# Patient Record
Sex: Female | Born: 1975 | Race: White | Hispanic: No | Marital: Married | Smoking: Never smoker
Health system: Southern US, Community
[De-identification: ages and names within clinical notes are randomized; demographics above are authoritative.]

## PROBLEM LIST (undated history)

## (undated) DIAGNOSIS — E119 Type 2 diabetes mellitus without complications: Secondary | ICD-10-CM

## (undated) DIAGNOSIS — E039 Hypothyroidism, unspecified: Secondary | ICD-10-CM

## (undated) DIAGNOSIS — E669 Obesity, unspecified: Secondary | ICD-10-CM

## (undated) DIAGNOSIS — J45909 Unspecified asthma, uncomplicated: Secondary | ICD-10-CM

## (undated) DIAGNOSIS — B369 Superficial mycosis, unspecified: Secondary | ICD-10-CM

## (undated) DIAGNOSIS — F32A Depression, unspecified: Secondary | ICD-10-CM

## (undated) DIAGNOSIS — F909 Attention-deficit hyperactivity disorder, unspecified type: Secondary | ICD-10-CM

## (undated) DIAGNOSIS — L719 Rosacea, unspecified: Secondary | ICD-10-CM

## (undated) DIAGNOSIS — I10 Essential (primary) hypertension: Secondary | ICD-10-CM

## (undated) DIAGNOSIS — T7840XA Allergy, unspecified, initial encounter: Secondary | ICD-10-CM

## (undated) DIAGNOSIS — N926 Irregular menstruation, unspecified: Secondary | ICD-10-CM

## (undated) DIAGNOSIS — N946 Dysmenorrhea, unspecified: Secondary | ICD-10-CM

## (undated) DIAGNOSIS — A6920 Lyme disease, unspecified: Secondary | ICD-10-CM

## (undated) DIAGNOSIS — N888 Other specified noninflammatory disorders of cervix uteri: Secondary | ICD-10-CM

## (undated) DIAGNOSIS — M1990 Unspecified osteoarthritis, unspecified site: Secondary | ICD-10-CM

## (undated) DIAGNOSIS — D708 Other neutropenia: Secondary | ICD-10-CM

## (undated) DIAGNOSIS — F419 Anxiety disorder, unspecified: Secondary | ICD-10-CM

## (undated) DIAGNOSIS — N921 Excessive and frequent menstruation with irregular cycle: Secondary | ICD-10-CM

## (undated) DIAGNOSIS — F329 Major depressive disorder, single episode, unspecified: Secondary | ICD-10-CM

## (undated) DIAGNOSIS — D219 Benign neoplasm of connective and other soft tissue, unspecified: Secondary | ICD-10-CM

## (undated) DIAGNOSIS — R635 Abnormal weight gain: Secondary | ICD-10-CM

## (undated) DIAGNOSIS — L121 Cicatricial pemphigoid: Secondary | ICD-10-CM

## (undated) HISTORY — DX: Essential (primary) hypertension: I10

## (undated) HISTORY — DX: Other specified noninflammatory disorders of cervix uteri: N88.8

## (undated) HISTORY — DX: Allergy, unspecified, initial encounter: T78.40XA

## (undated) HISTORY — DX: Depression, unspecified: F32.A

## (undated) HISTORY — DX: Benign neoplasm of connective and other soft tissue, unspecified: D21.9

## (undated) HISTORY — DX: Other neutropenia: D70.8

## (undated) HISTORY — DX: Type 2 diabetes mellitus without complications: E11.9

## (undated) HISTORY — PX: WISDOM TOOTH EXTRACTION: SHX21

## (undated) HISTORY — DX: Rosacea, unspecified: L71.9

## (undated) HISTORY — DX: Anxiety disorder, unspecified: F41.9

## (undated) HISTORY — DX: Unspecified asthma, uncomplicated: J45.909

## (undated) HISTORY — DX: Major depressive disorder, single episode, unspecified: F32.9

## (undated) HISTORY — DX: Dysmenorrhea, unspecified: N94.6

## (undated) HISTORY — DX: Abnormal weight gain: R63.5

## (undated) HISTORY — DX: Excessive and frequent menstruation with irregular cycle: N92.1

## (undated) HISTORY — PX: TONSILLECTOMY: SUR1361

## (undated) HISTORY — PX: CHOLECYSTECTOMY: SHX55

## (undated) HISTORY — DX: Attention-deficit hyperactivity disorder, unspecified type: F90.9

## (undated) HISTORY — DX: Superficial mycosis, unspecified: B36.9

## (undated) HISTORY — DX: Cicatricial pemphigoid: L12.1

## (undated) HISTORY — DX: Obesity, unspecified: E66.9

## (undated) HISTORY — DX: Irregular menstruation, unspecified: N92.6

## (undated) HISTORY — DX: Lyme disease, unspecified: A69.20

## (undated) HISTORY — DX: Hypothyroidism, unspecified: E03.9

## (undated) HISTORY — PX: OTHER SURGICAL HISTORY: SHX169

---

## 2000-11-21 ENCOUNTER — Other Ambulatory Visit: Admission: RE | Admit: 2000-11-21 | Discharge: 2000-11-21 | Payer: Self-pay | Admitting: Otolaryngology

## 2000-11-21 ENCOUNTER — Encounter (INDEPENDENT_AMBULATORY_CARE_PROVIDER_SITE_OTHER): Payer: Self-pay | Admitting: Specialist

## 2001-02-01 ENCOUNTER — Other Ambulatory Visit: Admission: RE | Admit: 2001-02-01 | Discharge: 2001-02-01 | Payer: Self-pay | Admitting: Obstetrics and Gynecology

## 2002-08-07 ENCOUNTER — Ambulatory Visit (HOSPITAL_COMMUNITY): Admission: RE | Admit: 2002-08-07 | Discharge: 2002-08-07 | Payer: Self-pay | Admitting: Obstetrics & Gynecology

## 2003-02-06 ENCOUNTER — Emergency Department (HOSPITAL_COMMUNITY): Admission: EM | Admit: 2003-02-06 | Discharge: 2003-02-06 | Payer: Self-pay | Admitting: *Deleted

## 2003-06-24 ENCOUNTER — Ambulatory Visit (HOSPITAL_COMMUNITY): Admission: RE | Admit: 2003-06-24 | Discharge: 2003-06-24 | Payer: Self-pay | Admitting: Obstetrics & Gynecology

## 2005-04-11 ENCOUNTER — Inpatient Hospital Stay (HOSPITAL_COMMUNITY): Admission: AD | Admit: 2005-04-11 | Discharge: 2005-04-14 | Payer: Self-pay | Admitting: Obstetrics and Gynecology

## 2005-04-12 ENCOUNTER — Encounter (INDEPENDENT_AMBULATORY_CARE_PROVIDER_SITE_OTHER): Payer: Self-pay | Admitting: *Deleted

## 2005-04-15 ENCOUNTER — Inpatient Hospital Stay (HOSPITAL_COMMUNITY): Admission: AD | Admit: 2005-04-15 | Discharge: 2005-04-18 | Payer: Self-pay | Admitting: Obstetrics and Gynecology

## 2005-04-16 ENCOUNTER — Ambulatory Visit: Payer: Self-pay | Admitting: Internal Medicine

## 2005-04-21 ENCOUNTER — Ambulatory Visit: Payer: Self-pay | Admitting: Internal Medicine

## 2005-05-08 ENCOUNTER — Ambulatory Visit: Payer: Self-pay | Admitting: Internal Medicine

## 2005-05-15 ENCOUNTER — Encounter: Payer: Self-pay | Admitting: Cardiology

## 2005-05-15 ENCOUNTER — Ambulatory Visit: Payer: Self-pay

## 2005-11-09 ENCOUNTER — Other Ambulatory Visit: Admission: RE | Admit: 2005-11-09 | Discharge: 2005-11-09 | Payer: Self-pay | Admitting: Obstetrics and Gynecology

## 2006-10-26 ENCOUNTER — Ambulatory Visit (HOSPITAL_COMMUNITY): Admission: RE | Admit: 2006-10-26 | Discharge: 2006-10-26 | Payer: Self-pay | Admitting: *Deleted

## 2006-11-01 ENCOUNTER — Ambulatory Visit: Payer: Self-pay | Admitting: General Surgery

## 2006-11-07 ENCOUNTER — Ambulatory Visit: Payer: Self-pay | Admitting: General Surgery

## 2006-12-19 ENCOUNTER — Ambulatory Visit: Payer: Self-pay

## 2007-03-07 ENCOUNTER — Ambulatory Visit: Payer: Self-pay | Admitting: Specialist

## 2007-04-25 ENCOUNTER — Encounter: Payer: Self-pay | Admitting: Family Medicine

## 2007-06-06 ENCOUNTER — Ambulatory Visit: Payer: Self-pay | Admitting: Specialist

## 2007-08-16 ENCOUNTER — Ambulatory Visit: Payer: Self-pay | Admitting: Specialist

## 2007-10-01 ENCOUNTER — Ambulatory Visit (HOSPITAL_COMMUNITY): Admission: RE | Admit: 2007-10-01 | Discharge: 2007-10-01 | Payer: Self-pay | Admitting: Obstetrics and Gynecology

## 2007-10-01 IMAGING — US US OB TRANSVAGINAL MODIFY
1 series · 14 of 28 positions shown · non-contrast
Comparison: none

OBSTETRICAL ULTRASOUND:
 This ultrasound exam was performed in the [HOSPITAL] Ultrasound Department.  The OB US report was generated in the AS system, and faxed to the ordering physician.  This report is also available in [REDACTED] PACS.

[Series 1: us ob comp less 14 wks · 14 of 28 slices shown]
[im 2/28]
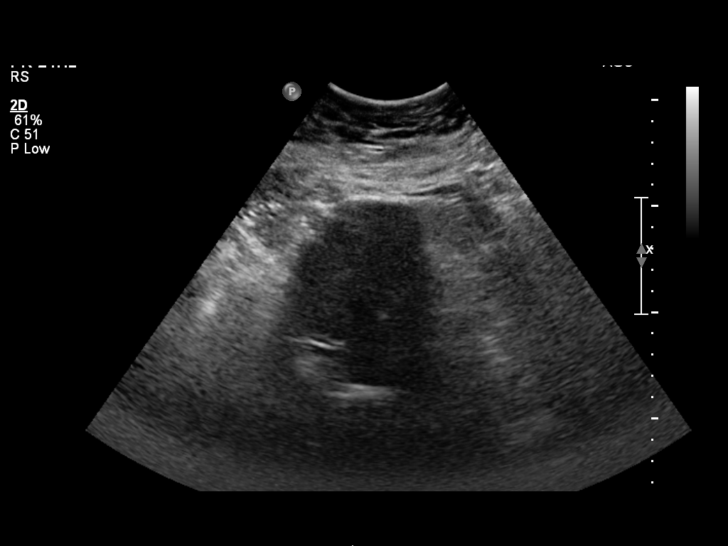
[im 4/28]
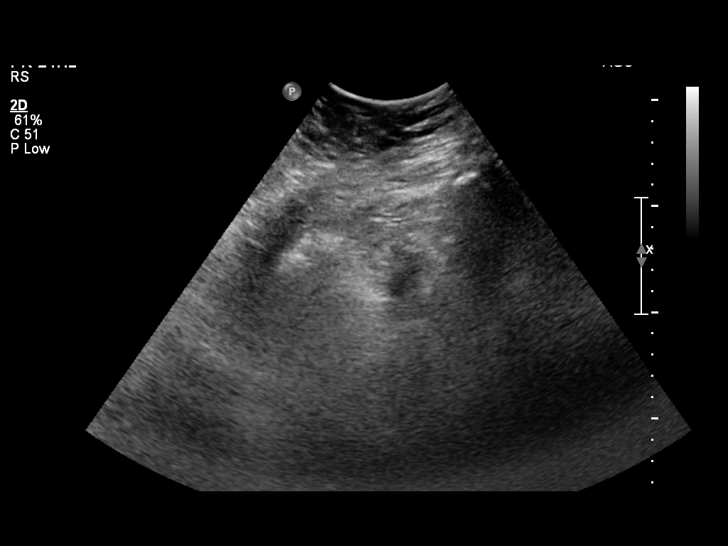
[im 6/28]
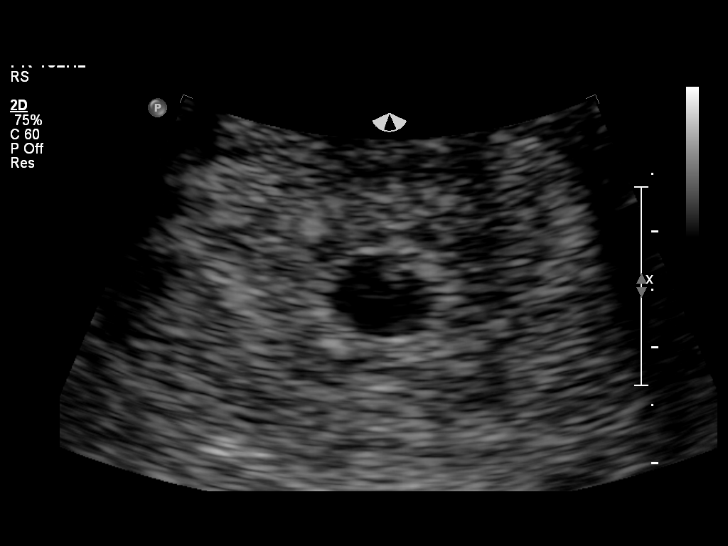
[im 8/28]
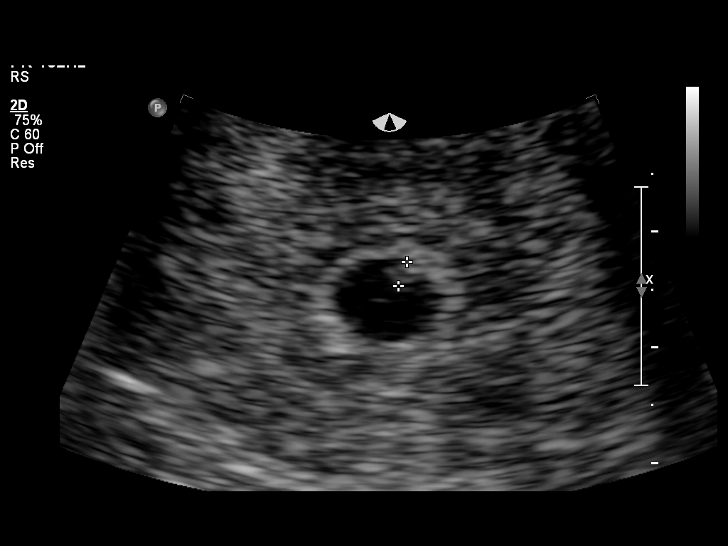
[im 10/28]
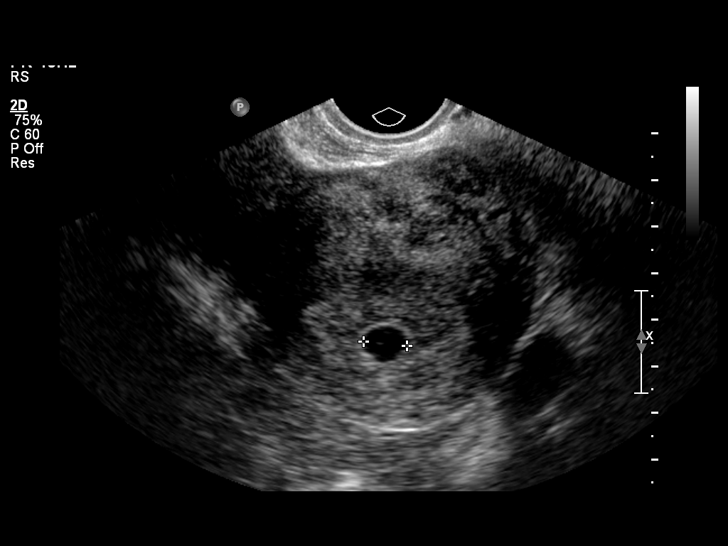
[im 12/28]
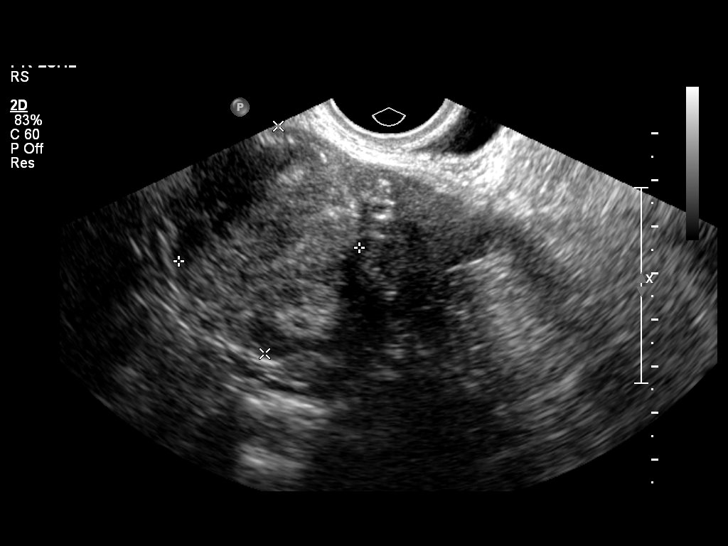
[im 14/28]
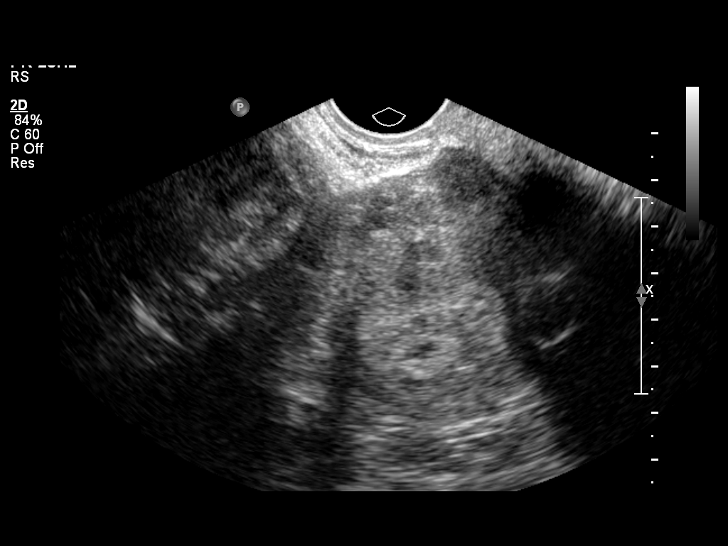
[im 16/28]
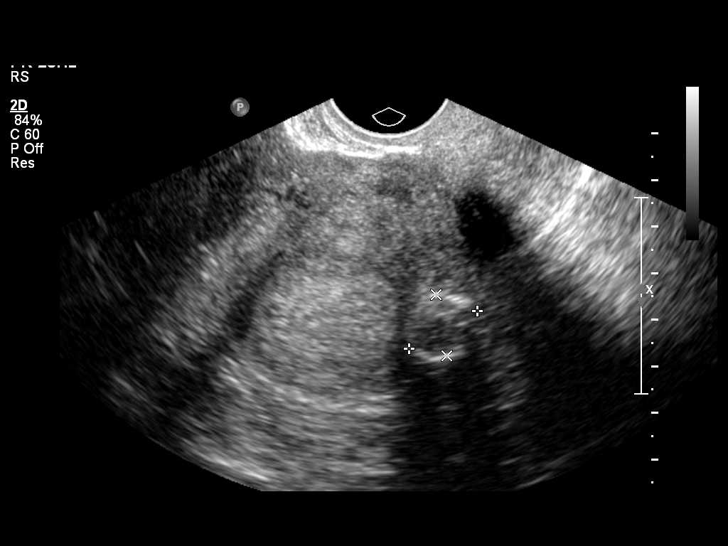
[im 18/28]
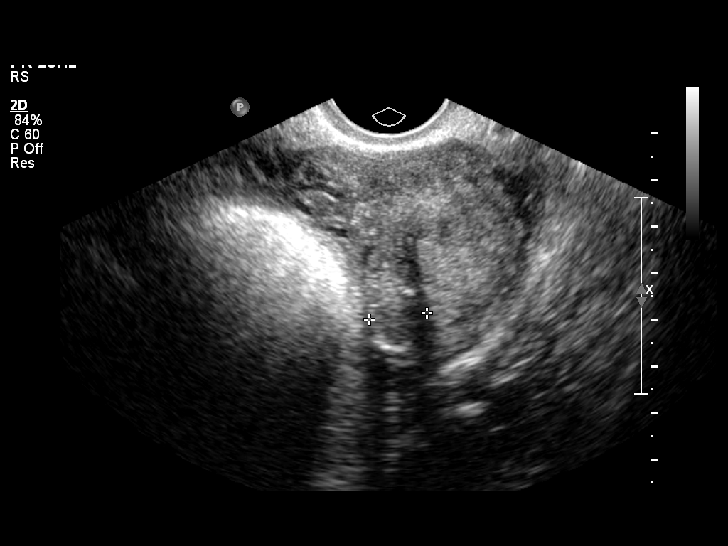
[im 20/28]
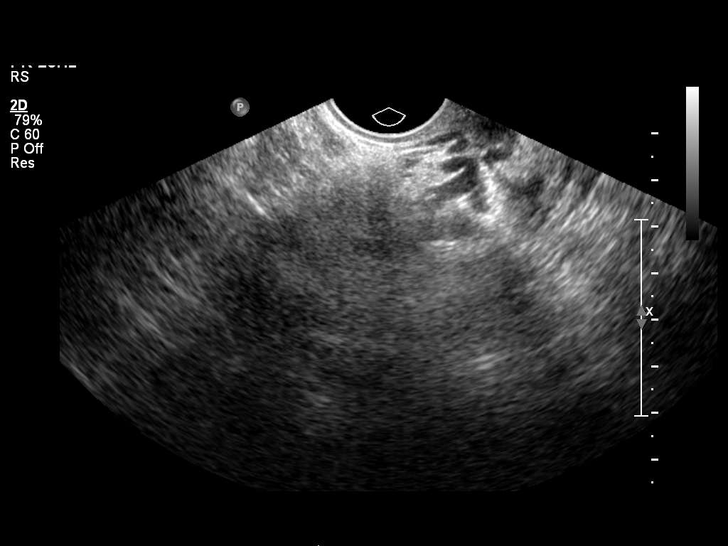
[im 22/28]
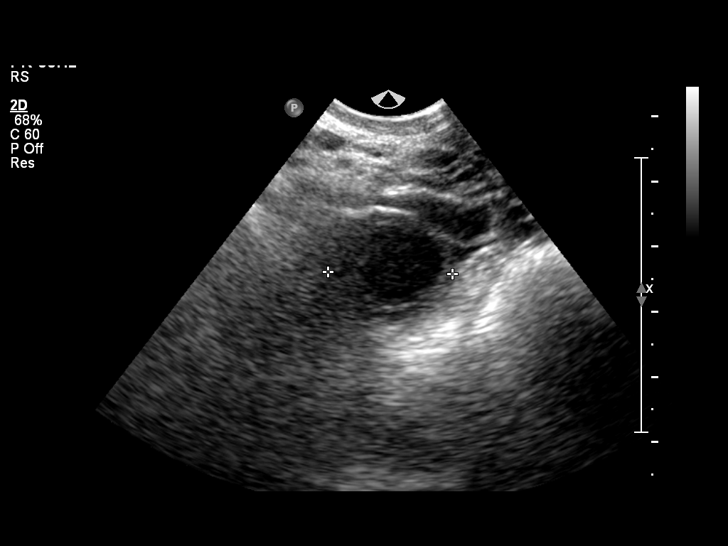
[im 24/28]
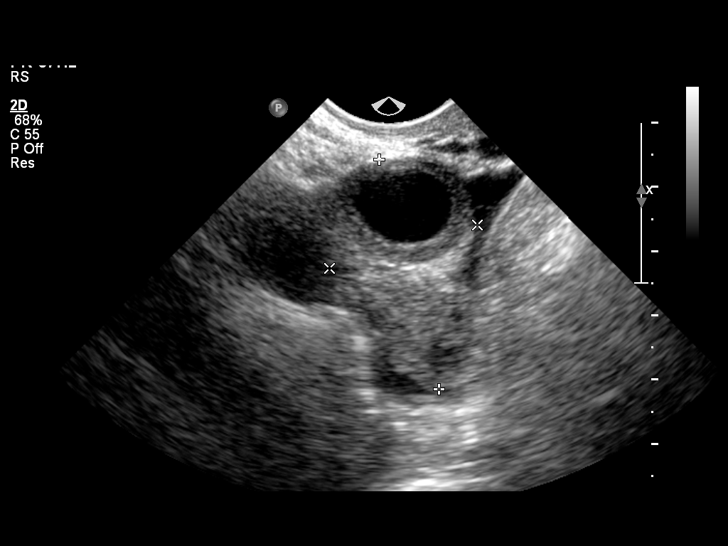
[im 26/28]
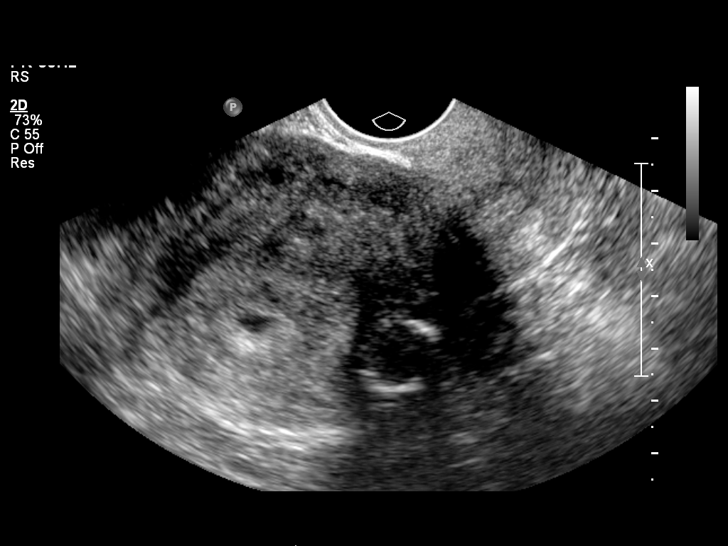
[im 28/28]
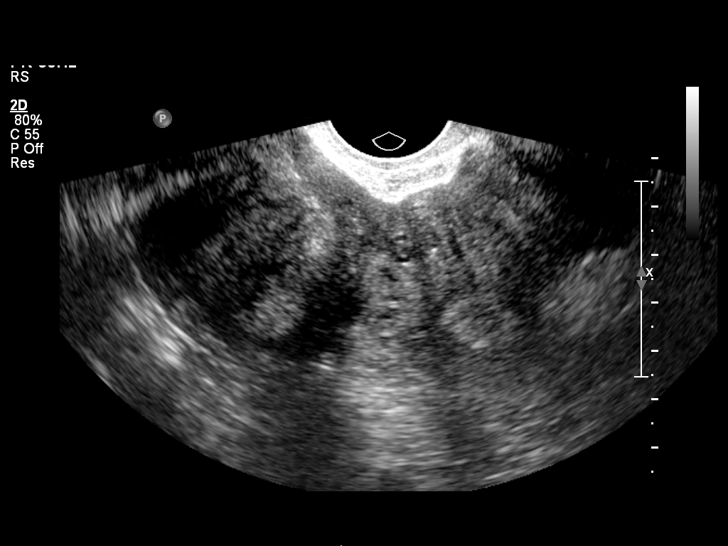

[14 of 28 positions shown; findings below may reference images not displayed]

IMPRESSION: See AS Obstetric US report.

## 2007-11-18 LAB — CONVERTED CEMR LAB: Pap Smear: NORMAL

## 2008-03-06 ENCOUNTER — Ambulatory Visit: Payer: Self-pay | Admitting: Specialist

## 2008-03-06 ENCOUNTER — Encounter: Payer: Self-pay | Admitting: Family Medicine

## 2008-07-09 ENCOUNTER — Ambulatory Visit: Payer: Self-pay | Admitting: Family Medicine

## 2008-07-09 DIAGNOSIS — N281 Cyst of kidney, acquired: Secondary | ICD-10-CM

## 2008-07-10 DIAGNOSIS — I10 Essential (primary) hypertension: Secondary | ICD-10-CM | POA: Insufficient documentation

## 2008-07-10 DIAGNOSIS — J309 Allergic rhinitis, unspecified: Secondary | ICD-10-CM | POA: Insufficient documentation

## 2008-09-16 ENCOUNTER — Other Ambulatory Visit: Admission: RE | Admit: 2008-09-16 | Discharge: 2008-09-16 | Payer: Self-pay | Admitting: Obstetrics and Gynecology

## 2008-11-22 ENCOUNTER — Inpatient Hospital Stay (HOSPITAL_COMMUNITY): Admission: AD | Admit: 2008-11-22 | Discharge: 2008-11-22 | Payer: Self-pay | Admitting: Obstetrics & Gynecology

## 2008-11-22 ENCOUNTER — Ambulatory Visit: Payer: Self-pay | Admitting: Obstetrics and Gynecology

## 2009-03-14 ENCOUNTER — Ambulatory Visit: Payer: Self-pay | Admitting: Obstetrics and Gynecology

## 2009-03-14 ENCOUNTER — Inpatient Hospital Stay (HOSPITAL_COMMUNITY): Admission: AD | Admit: 2009-03-14 | Discharge: 2009-03-14 | Payer: Self-pay | Admitting: Obstetrics and Gynecology

## 2009-03-15 ENCOUNTER — Inpatient Hospital Stay (HOSPITAL_COMMUNITY): Admission: AD | Admit: 2009-03-15 | Discharge: 2009-03-17 | Payer: Self-pay | Admitting: Obstetrics & Gynecology

## 2009-03-15 ENCOUNTER — Ambulatory Visit: Payer: Self-pay | Admitting: Obstetrics and Gynecology

## 2009-03-18 ENCOUNTER — Telehealth: Payer: Self-pay | Admitting: Family Medicine

## 2009-03-19 ENCOUNTER — Ambulatory Visit: Payer: Self-pay | Admitting: Family Medicine

## 2010-09-23 LAB — COMPREHENSIVE METABOLIC PANEL
ALT: 18 U/L (ref 0–35)
AST: 25 U/L (ref 0–37)
Albumin: 3 g/dL — ABNORMAL LOW (ref 3.5–5.2)
Alkaline Phosphatase: 135 U/L — ABNORMAL HIGH (ref 39–117)
BUN: 9 mg/dL (ref 6–23)
CO2: 17 mEq/L — ABNORMAL LOW (ref 19–32)
Calcium: 9.1 mg/dL (ref 8.4–10.5)
Chloride: 109 mEq/L (ref 96–112)
Creatinine, Ser: 0.72 mg/dL (ref 0.4–1.2)
GFR calc Af Amer: >60 mL/min (ref >60)
GFR calc non Af Amer: >60 mL/min (ref >60)
Glucose, Bld: 91 mg/dL (ref 70–99)
Potassium: 3.9 mEq/L (ref 3.5–5.1)
Sodium: 134 mEq/L — ABNORMAL LOW (ref 135–145)
Total Bilirubin: 0.4 mg/dL (ref 0.3–1.2)
Total Protein: 5.9 g/dL — ABNORMAL LOW (ref 6.0–8.3)

## 2010-09-23 LAB — URIC ACID: Uric Acid, Serum: 4.6 mg/dL (ref 2.4–7.0)

## 2010-09-23 LAB — CBC
HCT: 37.2 % (ref 36.0–46.0)
Hemoglobin: 12.4 g/dL (ref 12.0–15.0)
MCHC: 33.3 g/dL (ref 30.0–36.0)
MCV: 85.4 fL (ref 78.0–100.0)
Platelets: 173 10*3/uL (ref 150–400)
RBC: 4.35 MIL/uL (ref 3.87–5.11)
RDW: 16.5 % — ABNORMAL HIGH (ref 11.5–15.5)
WBC: 14.1 10*3/uL — ABNORMAL HIGH (ref 4.0–10.5)

## 2010-09-23 LAB — RPR: RPR Ser Ql: NONREACTIVE

## 2010-09-26 LAB — URINALYSIS, ROUTINE W REFLEX MICROSCOPIC
Bilirubin Urine: NEGATIVE
Glucose, UA: NEGATIVE mg/dL
Hgb urine dipstick: NEGATIVE
Ketones, ur: NEGATIVE mg/dL
Nitrite: NEGATIVE
Protein, ur: NEGATIVE mg/dL
Specific Gravity, Urine: 1.01 (ref 1.005–1.030)
Urobilinogen, UA: 0.2 mg/dL (ref 0.0–1.0)
pH: 7 (ref 5.0–8.0)

## 2010-11-04 NOTE — Discharge Summary (Signed)
Krista [name], Krista [name]          ACCOUNT NO.:  [id number]   MEDICAL RECORD NO.:  [id number]          PATIENT TYPE:  INP   LOCATION:  9138                          FACILITY:  [facility]   PHYSICIAN:  Zenaida Niece, M.D.DATE OF BIRTH:  01-07-1976   DATE OF ADMISSION:  04/11/2005  DATE OF DISCHARGE:  04/14/2005                                 DISCHARGE SUMMARY   ADMISSION DIAGNOSES:  1.  Intrauterine pregnancy at 36 weeks.  2.  Pre-eclampsia.   DISCHARGE DIAGNOSES:  1.  Intrauterine pregnancy at 36 weeks.  2.  Pre-eclampsia.  3.  Fetal macrosomia.   PROCEDURES:  On April 12, 2005, she had a vacuum-assisted vaginal  delivery.   HISTORY AND PHYSICAL:  This is a 36 year old, white female, gravida 2, para  0-0-1-0, with an EGA of [redacted] weeks, who presents for induction due to pre-  eclampsia.  She has been followed closely for elevated blood pressures with  normal labs and normal NSTs.  She did develop recent proteinuria and a 24-  hour urine specimen revealed 365 mg of protein.  She had no current symptoms  except for edema.  Prenatal care also complicated by a possible LGA baby.   PRENATAL LABORATORY:  Blood type is A+ with a negative antibody screen.  RPR  nonreactive.  Rubella immune.  HIV negative.  Hepatitis B surface antigen  negative.  Gonorrhea and Chlamydia negative.  Group B strep is negative.  One-hour Glucola 177 and three-hour GTT is normal.   PAST OBSTETRICAL HISTORY:  One spontaneous abortion.   PAST MEDICAL HISTORY:  1.  Borderline chronic hypertension.  2.  Depression.   MEDICATIONS:  Zoloft 50 mg daily.   PAST SURGICAL HISTORY:  1.  Laparoscopy with ovarian cystectomy.  2.  Tonsillectomy.   PHYSICAL EXAMINATION:  VITAL SIGNS:  She is afebrile with stable vital signs  with elevated blood pressures.  ABDOMEN:  Soft and nontender with a fundal height of 41-cm.  PELVIC:  Cervix was fingertip, 40, -2 to -3, and vertex presentation.   HOSPITAL COURSE:  The  patient was admitted and started on magnesium sulfate  for seizure prophylaxis.  She was also started on Pitocin for induction.  Labs came back normal.  Dr. Senaida Ores, on the evening of April 11, 2005,  was able to perform amniotomy when she was 1-cm dilated.  Gradually  throughout the morning and afternoon of April 12, 2005, she progressed in  labor.  She did receive an epidural and blood pressures remained stable.  She progressed to complete and pushed well and brought the vertex to +3,  where she had difficulty getting it past there.  Thus on the evening of  April 12, 2005, she had a vacuum-assisted vaginal delivery of a viable  female infant with Apgar's of 7 and 8, that weighed 8 pounds 12 ounces.  There was a tight nuchal cord which was clamped and cut on the perineum.  The baby was taken to the nursery early due to retractions but did well  after that.  Placenta delivered spontaneous, it was intact.  She had a  second-degree laceration repaired with  3-0 Vicryl with local block.  Estimated blood loss was 500 cc.  She was continued on magnesium sulfate  overnight.  On the morning of April 13, 2005, postpartum day #1, blood  pressures were stable and she had a good diuresis, and her magnesium was  stopped, and she was sent to the floor.  Her labs were stable.  On  postpartum day #2, blood pressures remained stable and she was felt to be  stable enough for discharge home.   DISCHARGE INSTRUCTIONS:  1.  Regular diet.  2.  Pelvic rest.  3.  Follow up in one week for blood pressure check.   MEDICATIONS:  Over-the-counter ibuprofen as needed.   She is given our discharge pamphlet.      Zenaida Niece, M.D.  Electronically Signed     TDM/MEDQ  D:  04/14/2005  T:  04/14/2005  Job:  [id number]
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
## 2010-11-04 NOTE — Op Note (Signed)
NAME:  Krista [name], Krista [name]                    ACCOUNT NO.:  [id number]   MEDICAL RECORD NO.:  [id number]                   PATIENT TYPE:  AMB   LOCATION:  DAY                                  FACILITY:  [facility]   PHYSICIAN:  Lazaro Arms, M.D.                DATE OF BIRTH:  1975/09/25   DATE OF PROCEDURE:  08/07/2002  DATE OF DISCHARGE:                                 OPERATIVE REPORT   PREOPERATIVE DIAGNOSIS:  Right ovarian cyst.   POSTOPERATIVE DIAGNOSIS:  Bilateral ovarian cysts.   PROCEDURE:  Laparoscopic bilateral ovarian cystectomy.   SURGEON:  Lazaro Arms, M.D.   ANESTHESIA:  General endotracheal.   FINDINGS:  The patient's larger ovarian cyst was actually on the left ovary.  It was down sort of in the cul-de-sac in the midline and had elevated her  right ovary so basically they are on top of each other.  The right ovary  also had a small cyst on it, but the left ovary was probably about 5 cm.  There was straw-colored fluid bilaterally.  It did not appear to be a  hemorrhagic corpus luteum or endometrioma.  The rest of the pelvis was  normal.  She did have a small myoma on the uterus.  It was broad-based,  pedunculated, and I did not attempt to remove it because of the broad base,  I did not want to stir up any bleeding.  I do not think it is clinically  significant.   DESCRIPTION OF PROCEDURE:  The patient was taken to the operating room and  placed in the supine position, where she underwent general endotracheal  anesthesia.  She was then placed in the dorsal lithotomy position and  prepped and draped in the usual sterile fashion.  A Hulka tenaculum was  placed for uterine manipulation.  A Foley catheter was placed.  An open  laparoscopy was performed and the abdomen was insufflated.  Five millimeter  trocars were placed under direct visualization in the midline suprapubic  area and the right lower quadrant without difficulty.  The above-noted  findings were  seen.  The Endoshears were used and the ovarian surface was  incised.  The cyst was punctured, and a hair-curler technique was used to  remove the cyst.  Also removed with the Endoshears the redundant ovarian  wall.  I did the same thing on the right using the hair-curler technique,  and there was good hemostasis of the ovarian cyst bed bilaterally.  The  pelvis was irrigated.  Everything was hemostatic.  Some fluid was left  behind to potentially diminish the incidence of adhesions.  The instruments  were removed.  The gas was allowed to escape.  Umbilical fascia was closed  with 0 Vicryl and skin was closed using skin staples.  Marcaine 0.5% was  injected subcu.  The patient was awakened from anesthesia and taken to the  recovery room in good, stable  condition.  All counts were correct x3, and  both cysts were sent to pathology for evaluation.                                               Lazaro Arms, M.D.    Loraine Maple  D:  08/07/2002  T:  08/07/2002  Job:  [id number]

## 2010-11-04 NOTE — Discharge Summary (Signed)
NAMEMARSHE, [name]          ACCOUNT NO.:  [id number]   MEDICAL RECORD NO.:  [id number]          PATIENT TYPE:  INP   LOCATION:  9305                          FACILITY:  [facility]   PHYSICIAN:  Malachi Pro. Ambrose Mantle, M.D. DATE OF BIRTH:  December 03, 1975   DATE OF ADMISSION:  04/15/2005  DATE OF DISCHARGE:  04/18/2005                                 DISCHARGE SUMMARY   ADDENDUM TO DISCHARGE SUMMARY FROM April 18, 2005:  At the time I dictated, the lab work was not in the chart. Since then the  medical record department has acquired the lab work, so I will dictate and  it should be added to the discharge summary.   LABORATORY DATA:  Admission hemoglobin 9.9, hematocrit 30.2, white count  15,100, platelet count 229,000.  Comprehensive metabolic profile was normal except for a sodium of 146, a  glucose of 118, SGOT of 39, total protein of 5.3 and albumin of 2.6. LDH was  319, uric acid 5.3. Arterial blood gases on room air:  pH of 7.487, PCO2 of  30.1, PO2 of 48.7, bicarbonate 22.6, oxygen saturation 77. Clean catch urine  showed multiple bacterial morphotype's present. Sedimentation rate was 45.  BNP was 172. Follow up sodium was 150, glucose 125. Follow up white count  12,400, hemoglobin 9.8, hematocrit 29.      Malachi Pro. Ambrose Mantle, M.D.  Electronically Signed     TFH/MEDQ  D:  05/21/2005  T:  05/21/2005  Job:  [id number]
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
## 2010-11-16 ENCOUNTER — Inpatient Hospital Stay (HOSPITAL_COMMUNITY)
Admission: RE | Admit: 2010-11-16 | Discharge: 2010-11-19 | DRG: 766 | Disposition: A | Discharge status: Home / Self Care | Payer: Self-pay | Source: Ambulatory Visit | Attending: Obstetrics & Gynecology | Admitting: Obstetrics & Gynecology

## 2010-11-16 ENCOUNTER — Other Ambulatory Visit: Payer: Self-pay | Admitting: Obstetrics & Gynecology

## 2010-11-16 DIAGNOSIS — O34599 Maternal care for other abnormalities of gravid uterus, unspecified trimester: Secondary | ICD-10-CM | POA: Diagnosis present

## 2010-11-16 DIAGNOSIS — O3660X0 Maternal care for excessive fetal growth, unspecified trimester, not applicable or unspecified: Principal | ICD-10-CM | POA: Diagnosis present

## 2010-11-16 DIAGNOSIS — D259 Leiomyoma of uterus, unspecified: Secondary | ICD-10-CM | POA: Diagnosis present

## 2010-11-16 DIAGNOSIS — O322XX0 Maternal care for transverse and oblique lie, not applicable or unspecified: Secondary | ICD-10-CM | POA: Diagnosis present

## 2010-11-16 DIAGNOSIS — D4959 Neoplasm of unspecified behavior of other genitourinary organ: Secondary | ICD-10-CM | POA: Diagnosis present

## 2010-11-16 LAB — SURGICAL PCR SCREEN
MRSA, PCR: NEGATIVE
Staphylococcus aureus: POSITIVE — AB

## 2010-11-16 LAB — CBC
HCT: 34.9 % — ABNORMAL LOW (ref 36.0–46.0)
Hemoglobin: 11.3 g/dL — ABNORMAL LOW (ref 12.0–15.0)
MCH: 24.9 pg — ABNORMAL LOW (ref 26.0–34.0)
MCHC: 32.4 g/dL (ref 30.0–36.0)
MCV: 76.9 fL — ABNORMAL LOW (ref 78.0–100.0)
Platelets: 208 10*3/uL (ref 150–400)
RBC: 4.54 MIL/uL (ref 3.87–5.11)
RDW: 16.2 % — ABNORMAL HIGH (ref 11.5–15.5)
WBC: 14.3 10*3/uL — ABNORMAL HIGH (ref 4.0–10.5)

## 2010-11-16 LAB — RPR: RPR Ser Ql: NONREACTIVE

## 2010-11-17 LAB — GLUCOSE, CAPILLARY: Glucose-Capillary: 30 mg/dL — CL (ref 70–99)

## 2010-11-17 LAB — CBC
HCT: 29.2 % — ABNORMAL LOW (ref 36.0–46.0)
Hemoglobin: 9.2 g/dL — ABNORMAL LOW (ref 12.0–15.0)
MCH: 25.1 pg — ABNORMAL LOW (ref 26.0–34.0)
MCHC: 31.5 g/dL (ref 30.0–36.0)
MCV: 79.6 fL (ref 78.0–100.0)
Platelets: 146 10*3/uL — ABNORMAL LOW (ref 150–400)
RBC: 3.67 MIL/uL — ABNORMAL LOW (ref 3.87–5.11)
RDW: 15.8 % — ABNORMAL HIGH (ref 11.5–15.5)
WBC: 10.2 10*3/uL (ref 4.0–10.5)

## 2010-11-18 LAB — COMPREHENSIVE METABOLIC PANEL
ALT: 12 U/L (ref 0–35)
AST: 19 U/L (ref 0–37)
Albumin: 2.3 g/dL — ABNORMAL LOW (ref 3.5–5.2)
Alkaline Phosphatase: 88 U/L (ref 39–117)
BUN: 7 mg/dL (ref 6–23)
CO2: 22 mEq/L (ref 19–32)
Calcium: 8.7 mg/dL (ref 8.4–10.5)
Chloride: 105 mEq/L (ref 96–112)
Creatinine, Ser: 0.91 mg/dL (ref 0.4–1.2)
GFR calc Af Amer: >60 mL/min (ref >60)
GFR calc non Af Amer: >60 mL/min (ref >60)
Glucose, Bld: 111 mg/dL — ABNORMAL HIGH (ref 70–99)
Potassium: 3.6 mEq/L (ref 3.5–5.1)
Sodium: 135 mEq/L (ref 135–145)
Total Bilirubin: 0.2 mg/dL — ABNORMAL LOW (ref 0.3–1.2)
Total Protein: 5.6 g/dL — ABNORMAL LOW (ref 6.0–8.3)

## 2010-11-18 LAB — CBC
HCT: 29.2 % — ABNORMAL LOW (ref 36.0–46.0)
Hemoglobin: 9 g/dL — ABNORMAL LOW (ref 12.0–15.0)
MCH: 25 pg — ABNORMAL LOW (ref 26.0–34.0)
MCHC: 30.8 g/dL (ref 30.0–36.0)
MCV: 81.1 fL (ref 78.0–100.0)
Platelets: 159 10*3/uL (ref 150–400)
RBC: 3.6 MIL/uL — ABNORMAL LOW (ref 3.87–5.11)
RDW: 15.9 % — ABNORMAL HIGH (ref 11.5–15.5)
WBC: 10.4 10*3/uL (ref 4.0–10.5)

## 2010-11-18 LAB — PROTEIN / CREATININE RATIO, URINE: Creatinine, Urine: 48.62 mg/dL

## 2010-11-30 NOTE — Op Note (Signed)
Krista [name], Krista [name]          ACCOUNT NO.:  [id number]  MEDICAL RECORD NO.:  [id number]           PATIENT TYPE:  I  LOCATION:  9103                          FACILITY:  [facility]  PHYSICIAN:  Lazaro Arms, M.D.   DATE OF BIRTH:  [birth year]  DATE OF PROCEDURE:  11/16/2010 DATE OF DISCHARGE:                              OPERATIVE REPORT   PREOPERATIVE DIAGNOSES: 1. Intrauterine pregnancy at 43 and 0/7 weeks' gestation. 2. Fetal macrosomia. 3. Fetal lie is oblique and out of the pelvis. 4. Anterior myoma.  POSTOPERATIVE DIAGNOSES: 1. Intrauterine pregnancy at 60 and 0/7 weeks' gestation. 2. Fetal macrosomia. 3. Fetal lie is oblique and out of the pelvis. 4. Anterior myoma.  PROCEDURES: 1. Primary low transverse resection. 2. Myomectomy.  SURGEON:  Lazaro Arms, MD  ANESTHESIA:  Spinal.  FINDINGS:  The patient in the office 2 weeks ago was found to have an estimated fetal weight of approximately 10 pounds.  We were awaiting for the fetal vertex to come down into the pelvis.  However it remained out of the pelvis, barely palpable and oblique.  As a result over the past week or so, we talked about management of this clinical situation.  The patient has proven pelvis 9 pounds 14 ounces with fetal vertex out the pelvis.  It is my opinion that there was a problem with fetal vertex getting into the pelvis.  We talked with the parents at length and they agreed we proceed with this low transverse resection tonight.  She was delivered of a viable female at 18:32 with Apgars of 8 and 9 weighing 10 pounds and 8 ounces.  The head circumference of this was 15 inches. Cord pH was 7.28.  There was also had an anterior myoma which was quite large and was actually you could see at the external abdomen and it had a broad base, was not purely pedunculated but I was able to put a couple of Kelly clamps across it and removed it.  DESCRIPTION OF OPERATION:  The patient was taken to the  operating room, placed in sitting position where she underwent a spinal anesthetic.  She was placed in supine position with tilt to the left, prepped and draped in usual sterile fashion.  A Foley catheter was placed.  Pfannenstiel skin incision was made, carried down sharply.  Rectus fascia scored in the midline and extended laterally.  Fascia was taken off the muscle superiorly and inferiorly without difficulty.  The muscles were divided. Peritoneal cavity was entered.  Bladder blade was placed.  A low transverse hysterotomy incision was made.  A vacuum extractor was used and we delivered a 10-pound 8-ounces female at 18:32 with Apgars of 8 and 9 weighing 10 pounds and 8 ounces.  Cord pH 7.28.  Neonatologist was present for routine resuscitative efforts.  Cord blood was also sent. Placenta was delivered spontaneously.  The uterus was exteriorized, closed the uterus in 2 layers, first being a running interlocking layer and the second being imbricating layer with good hemostasis.  I then turned my attention to the anterior myoma, clamped across with 2 Kelly clamps, removed with electrocautery and put  in 4 figure-of-eight Monocryl sutures with good hemostasis.  The uterus was replaced to peritoneal cavity without difficulty.  The pelvis was irrigated vigorously.  All pedicles were found to be hemostatic.  The muscles of peritoneum reapproximated loosely.  The fascia was closed using 0 Vicryl running.  The subcutaneous tissues was made hemostatic, irrigated and reapproximated.  The skin was closed using 4-0 Vicryl in a Keith needle in subcuticular fashion and Dermabond.  The patient tolerated the procedure well.  She experienced 750 mL of blood loss, taken to recovery room in good stable condition.  All counts correct x3.     Lazaro Arms, M.D.     Krista [name]  D:  11/17/2010  T:  11/17/2010  Job:  [id number]  Electronically Signed by Duane Lope M.D. on 11/30/2010 02:51:14 PM

## 2010-12-05 NOTE — Discharge Summary (Signed)
Krista [name], Krista [name]          ACCOUNT NO.:  [id number]  MEDICAL RECORD NO.:  [id number]           PATIENT TYPE:  I  LOCATION:  9103                          FACILITY:  [facility]  PHYSICIAN:  Lesly Dukes, M.D. DATE OF BIRTH:  10-03-1975  DATE OF ADMISSION:  11/16/2010 DATE OF DISCHARGE:  11/19/2010                              DISCHARGE SUMMARY   ADMITTING DIAGNOSES: 1. Intrauterine pregnancy at 40 and 0/7 weeks. 2. Fetal macrosomia. 3. Oblique fetal lie and out of the pelvis. 4. Anterior myoma.  DISCHARGE DIAGNOSES: 1. Anterior myoma. 2. A 12-cm fibroid. 3. Elevated blood pressures with negative lab work.  PROCEDURES: 1. Primary low transverse cesarean section. 2. Myomectomy.  HOSPITAL COURSE:  Mr. Mccraw is a 35 year old, gravida 5, para 2-0-2- 2, who presented at 33 and 0/7 weeks' gestation for scheduled cesarean section due to fetal macrosomia with oblique fetal lie as well as a vertex being out of the pelvis.  Her pregnancy has been followed by the [facility]. 1. It has been remarkable for possible hypertension that does not     require medications. 2. Uterine fibroids. 3. Hx postpartum depression. 4. History of macrosomia with previous 9 pounds 14 ounces vaginal     delivery without complications.  The patient was taken to the operating room and cesarean section was performed by Dr. Duane Lope.  Infant was a viable female, weighing 10 pounds 8 ounces with Apgars of 8 and 9.  The NICU team was present at the birth.  The patient tolerated the remainder of the cesarean section as well as the myomectomy well and was taken to the recovery room in good condition.  On postop day #1, the patient was feeling well. Her blood pressure was 146/73.  Her hemoglobin was 9.2, hematocrit 29.2, white blood cell count 10.2, and platelets 146,000.  Preoperative CBC: hemoglobin 11.3, hematocrit 34.9, white blood cell count 14.3, and platelets 208,000.  The  patient was breastfeeding.  By postop day #2, she was having some blood pressures that were slightly elevated of 157/88 and 151/84.  She was started on hydrochlorothiazide and also laboratories were drawn.  The results of that CBC: hgb 9.0, hematocrit 29.2, white blood cell count 10.4, and platelets 159,000.  Liver enzymes were 19 and 12.  Urine protein-creatinine ratio was 0.17.  By postop day #3, the patient's blood pressures have stabilized slightly at 147/80 and her other vital signs are stable.  She continued to pump; now her infant is in the NICU but she is desiring to go home as her infant she should be discharged later today.  Her physical exam was within normal limits. Incision is healing well and she is deemed to have received the full benefit her hospital stay and she will be discharged home.  DISCHARGE INSTRUCTIONS:  Per postpartum handout with the addition of PIH precautions.  DISCHARGE FOLLOWUP:  Follow up is already scheduled for [facility] on November 23, 2010.  DISCHARGE MEDICATIONS: 1. Hydrochlorothiazide 25 mg 1 p.o. daily. 2. Motrin 600 mg 1 p.o. q.6 h. p.r.n. pain. 3. Percocet 5/325 one to two p.o. q.4-6 h. p.r.n. pain. 4. Prenatal  vitamin 1 p.o. daily.     Cam Hai, C.N.M.   ______________________________ Lesly Dukes, M.D.    KS/MEDQ  D:  11/19/2010  T:  11/19/2010  Job:  [id number]  Electronically Signed by Cam Hai C.N.M. on 11/27/2010 10:23:48 PM Electronically Signed by Elsie Lincoln M.D. on 12/05/2010 01:42:26 PM

## 2012-08-09 ENCOUNTER — Other Ambulatory Visit: Payer: Self-pay | Admitting: Adult Health

## 2012-08-09 ENCOUNTER — Other Ambulatory Visit (HOSPITAL_COMMUNITY)
Admission: RE | Admit: 2012-08-09 | Discharge: 2012-08-09 | Disposition: A | Discharge status: Home / Self Care | Payer: BC Managed Care – PPO | Source: Ambulatory Visit | Attending: Obstetrics and Gynecology | Admitting: Obstetrics and Gynecology

## 2012-08-09 DIAGNOSIS — Z1151 Encounter for screening for human papillomavirus (HPV): Secondary | ICD-10-CM | POA: Insufficient documentation

## 2012-08-09 DIAGNOSIS — Z01419 Encounter for gynecological examination (general) (routine) without abnormal findings: Secondary | ICD-10-CM | POA: Insufficient documentation

## 2012-09-25 ENCOUNTER — Telehealth: Payer: Self-pay | Admitting: Adult Health

## 2012-09-25 NOTE — Telephone Encounter (Signed)
Pt started her period and complains of headache, has taken OTC meds helped a little, try excedrin migraine or soda with caffeine.I don' t think its the celexa or micronor, since she has been taking since end of February. Denies any vision changes. Call prn

## 2012-09-25 NOTE — Telephone Encounter (Signed)
Pt c/o severe headache this am, believes caused by new BC. Changed to Microm at last appt (08/16/2012). Please advise.

## 2012-09-27 ENCOUNTER — Ambulatory Visit (INDEPENDENT_AMBULATORY_CARE_PROVIDER_SITE_OTHER): Payer: BC Managed Care – PPO | Admitting: Adult Health

## 2012-09-27 ENCOUNTER — Encounter: Payer: Self-pay | Admitting: Adult Health

## 2012-09-27 VITALS — BP 130/86 | Ht 69.0 in | Wt 302.0 lb

## 2012-09-27 DIAGNOSIS — I10 Essential (primary) hypertension: Secondary | ICD-10-CM

## 2012-09-27 DIAGNOSIS — Z3009 Encounter for other general counseling and advice on contraception: Secondary | ICD-10-CM

## 2012-09-27 DIAGNOSIS — F32A Depression, unspecified: Secondary | ICD-10-CM | POA: Insufficient documentation

## 2012-09-27 DIAGNOSIS — E669 Obesity, unspecified: Secondary | ICD-10-CM | POA: Insufficient documentation

## 2012-09-27 DIAGNOSIS — F329 Major depressive disorder, single episode, unspecified: Secondary | ICD-10-CM

## 2012-09-27 NOTE — Patient Instructions (Addendum)
Start back on HCTZ  Continue current dose celexa Continue micronor Sign up for my chart RTC 3 months follow up Try weight loss efforts

## 2012-09-27 NOTE — Progress Notes (Signed)
Subjective:     Patient ID: Krista [name], female   DOB: 1975-06-25, 37 y.o.   MRN: [id number]  HPIJennifer is  37 year old white female in for review of meds and BP and depression    Review of SystemsPt. Had headache for 2 days none now, no blurred vision, has had cold, says she feels better just still would like to sleep. Is on 1st pack of micronor and doing ok. Has been off HCTZ for about 2 weeks, needs to get refill and resume taking. Reviewed past medical, surgical, social and family history. Medications and allergies reviewed.   She is taking a self help class in [place]. Objective:   Physical ExamSkin warm and dry, lungs clear to auscultation bilaterally, cardiovascular regular rate and rhythm.Blood pressure 130/86, height 5\' 9"  (1.753 m), weight 302 lb (136.986 kg), last menstrual period 09/25/2012.Mood seems better.     Assessment:      Hypertension Depression Contraceptive management Obesity    Plan:      Resume  HCTZ Continue Celexa and micronor Try weight loss efforts Follow up 3 months

## 2012-12-30 ENCOUNTER — Ambulatory Visit: Payer: BC Managed Care – PPO | Admitting: Adult Health

## 2013-01-01 ENCOUNTER — Ambulatory Visit (INDEPENDENT_AMBULATORY_CARE_PROVIDER_SITE_OTHER): Payer: BC Managed Care – PPO | Admitting: Obstetrics & Gynecology

## 2013-01-01 ENCOUNTER — Encounter: Payer: Self-pay | Admitting: Obstetrics & Gynecology

## 2013-01-01 VITALS — BP 130/80 | Ht 69.0 in | Wt 309.0 lb

## 2013-01-01 DIAGNOSIS — B373 Candidiasis of vulva and vagina: Secondary | ICD-10-CM

## 2013-01-01 LAB — HEMOGLOBIN A1C
Hgb A1c MFr Bld: 5.7 % — ABNORMAL HIGH (ref <5.7)
Mean Plasma Glucose: 117 mg/dL — ABNORMAL HIGH (ref <117)

## 2013-01-01 MED ORDER — FLUCONAZOLE 100 MG PO TABS: ORAL_TABLET | ORAL | Status: DC

## 2013-01-01 NOTE — Addendum Note (Signed)
Addended by: Lazaro Arms on: 01/01/2013 03:31 PM   Modules accepted: Orders

## 2013-01-01 NOTE — Progress Notes (Signed)
Patient ID: Krista [name], female   DOB: 27-Mar-1976, 37 y.o.   MRN: [id number] Krista [name] has had discharge itching symptoms for several days  exam reveals a heavy heavy white yeast discharge Treated with gentian violet Will check a Hemoglobin A1C due to the heavy infection present

## 2013-01-01 NOTE — Patient Instructions (Signed)
Monilial Vaginitis  Vaginitis in a soreness, swelling and redness (inflammation) of the vagina and vulva. Monilial vaginitis is not a sexually transmitted infection.  CAUSES   Yeast vaginitis is caused by yeast (candida) that is normally found in your vagina. With a yeast infection, the candida has overgrown in number to a point that upsets the chemical balance.  SYMPTOMS   · White, thick vaginal discharge.  · Swelling, itching, redness and irritation of the vagina and possibly the lips of the vagina (vulva).  · Burning or painful urination.  · Painful intercourse.  DIAGNOSIS   Things that may contribute to monilial vaginitis are:  · Postmenopausal and virginal states.  · Pregnancy.  · Infections.  · Being tired, sick or stressed, especially if you had monilial vaginitis in the past.  · Diabetes. Good control will help lower the chance.  · Birth control pills.  · Tight fitting garments.  · Using bubble bath, feminine sprays, douches or deodorant tampons.  · Taking certain medications that kill germs (antibiotics).  · Sporadic recurrence can occur if you become ill.  TREATMENT   Your caregiver will give you medication.  · There are several kinds of anti monilial vaginal creams and suppositories specific for monilial vaginitis. For recurrent yeast infections, use a suppository or cream in the vagina 2 times a week, or as directed.  · Anti-monilial or steroid cream for the itching or irritation of the vulva may also be used. Get your caregiver's permission.  · Painting the vagina with methylene blue solution may help if the monilial cream does not work.  · Eating yogurt may help prevent monilial vaginitis.  HOME CARE INSTRUCTIONS   · Finish all medication as prescribed.  · Do not have sex until treatment is completed or after your caregiver tells you it is okay.  · Take warm sitz baths.  · Do not douche.  · Do not use tampons, especially scented ones.  · Wear cotton underwear.  · Avoid tight pants and panty  hose.  · Tell your sexual partner that you have a yeast infection. They should go to their caregiver if they have symptoms such as mild rash or itching.  · Your sexual partner should be treated as well if your infection is difficult to eliminate.  · Practice safer sex. Use condoms.  · Some vaginal medications cause latex condoms to fail. Vaginal medications that harm condoms are:  · Cleocin cream.  · Butoconazole (Femstat®).  · Terconazole (Terazol®) vaginal suppository.  · Miconazole (Monistat®) (may be purchased over the counter).  SEEK MEDICAL CARE IF:   · You have a temperature by mouth above 102° F (38.9° C).  · The infection is getting worse after 2 days of treatment.  · The infection is not getting better after 3 days of treatment.  · You develop blisters in or around your vagina.  · You develop vaginal bleeding, and it is not your menstrual period.  · You have pain when you urinate.  · You develop intestinal problems.  · You have pain with sexual intercourse.  Document Released: 03/15/2005 Document Revised: 08/28/2011 Document Reviewed: 11/27/2008  ExitCare® Patient Information ©2014 [employer].

## 2013-01-09 ENCOUNTER — Ambulatory Visit: Payer: BC Managed Care – PPO | Admitting: Adult Health

## 2013-04-24 ENCOUNTER — Other Ambulatory Visit: Payer: Self-pay

## 2013-09-05 ENCOUNTER — Other Ambulatory Visit: Payer: Self-pay | Admitting: Adult Health

## 2014-02-11 ENCOUNTER — Ambulatory Visit (INDEPENDENT_AMBULATORY_CARE_PROVIDER_SITE_OTHER): Payer: BC Managed Care – PPO | Admitting: Adult Health

## 2014-02-11 ENCOUNTER — Encounter: Payer: Self-pay | Admitting: Adult Health

## 2014-02-11 VITALS — BP 140/90 | Ht 70.0 in | Wt 268.5 lb

## 2014-02-11 DIAGNOSIS — N920 Excessive and frequent menstruation with regular cycle: Secondary | ICD-10-CM

## 2014-02-11 DIAGNOSIS — N921 Excessive and frequent menstruation with irregular cycle: Secondary | ICD-10-CM

## 2014-02-11 HISTORY — DX: Excessive and frequent menstruation with irregular cycle: N92.1

## 2014-02-11 LAB — POCT HEMOGLOBIN: Hemoglobin: 13.7 g/dL (ref 12.2–16.2)

## 2014-02-11 NOTE — Progress Notes (Signed)
Subjective:     Patient ID: Krista [name], female   DOB: [birth year], 38 y.o.   MRN: [id number]  HPI Anneke is a 38 year old white female,married in complaining of bleeding heavy at times and shorter cycles and bleeding longer.She is Errin for birth control  and now Prometrium and testosterone per Carroll Kinds at [facility] in [place].She also complains of cramps on and off and clots. She is may want 1 more child.She has lost 40 lbs.  Review of Systems See HPI Reviewed past medical,surgical, social and family history. Reviewed medications and allergies.     Objective:   Physical Exam BP 140/90  Ht 5\' 10"  (1.778 m)  Wt 268 lb 8 oz (121.791 kg)  BMI 38.53 kg/m2  LMP 08/21/2015HGB 13.7, Skin warm and dry.Pelvic: external genitalia is normal in appearance, vagina: period blood without odor, cervix:smooth and bulbous, uterus: normal size, shape and contour, non tender, no masses felt, adnexa: no masses or tenderness noted.   Discussed some options, like IUD,Depo or megace, but will get [place] first to assess uterus for polyps and fibroids.  Assessment:     Menometrorrhagia     Plan:     Return in 2 days for [place] and see me Review handout on menorhagia

## 2014-02-11 NOTE — Patient Instructions (Signed)

## 2014-02-13 ENCOUNTER — Ambulatory Visit (INDEPENDENT_AMBULATORY_CARE_PROVIDER_SITE_OTHER): Payer: BC Managed Care – PPO | Admitting: Adult Health

## 2014-02-13 ENCOUNTER — Ambulatory Visit (INDEPENDENT_AMBULATORY_CARE_PROVIDER_SITE_OTHER): Payer: BC Managed Care – PPO

## 2014-02-13 ENCOUNTER — Encounter: Payer: Self-pay | Admitting: Adult Health

## 2014-02-13 VITALS — BP 148/70 | Ht 70.0 in | Wt 266.5 lb

## 2014-02-13 DIAGNOSIS — N920 Excessive and frequent menstruation with regular cycle: Secondary | ICD-10-CM

## 2014-02-13 DIAGNOSIS — N921 Excessive and frequent menstruation with irregular cycle: Secondary | ICD-10-CM

## 2014-02-13 DIAGNOSIS — D259 Leiomyoma of uterus, unspecified: Secondary | ICD-10-CM

## 2014-02-13 DIAGNOSIS — D219 Benign neoplasm of connective and other soft tissue, unspecified: Secondary | ICD-10-CM

## 2014-02-13 HISTORY — DX: Benign neoplasm of connective and other soft tissue, unspecified: D21.9

## 2014-02-13 MED ORDER — MEGESTROL ACETATE 40 MG PO TABS: ORAL_TABLET | ORAL | Status: DC

## 2014-02-13 NOTE — Progress Notes (Signed)
Subjective:     Patient ID: Krista [name], female   DOB: [birth year], 38 y.o.   MRN: [id number]  HPI Lance is a 38 year old white female in for [place] for menometrorrhagia.  Review of Systems See HPI Reviewed past medical,surgical, social and family history. Reviewed medications and allergies.     Objective:   Physical Exam BP 148/70  Ht 5\' 10"  (1.778 m)  Wt 266 lb 8 oz (120.884 kg)  BMI 38.24 kg/m2  LMP 08/07/2015Reviewed [place] with pt.   Uterus 9.2 x 5.6 x 4.5 cm, with 4 fibroids noted (2 calcified) largest 2 = 15 & 10mm  Endometrium 4.0 mm, slightly distorted by fibroids  Right ovary 2.4 x 1.6 x 1.4 cm,  Left ovary 2.1 x 1.5 x 1.1 cm,  No free fluid or adnexal masses noted within the pelvis  Technician Comments:  Anteverted uterus with multiple fibroids noted, Endom-4.66mm slightly distorted by fibroids, bilateral adnexa/ovaries appears WNL  Discussed with Dr Elonda Husky, will stop errin and prometrium and testosterone, and Rx megace.Pt agrees with treatment plan. Discussed that she may still want one more child, and advised to do before 45.  Assessment:     Menometrorrhagia  Fibroids     Plan:     Stop errin,prometirum and testosterone  Rx megace 40 mg #45 3 x 5 days then 2 x 5 days then 1 daily with 1 refill   Follow up in 4 weeks Request labs from T Worrell,NP Use condoms

## 2014-02-13 NOTE — Patient Instructions (Signed)
Stop errin,use condoms Stop Prometrium Stop testosterone  Take megace  Follow up in 4 weeks Result labs

## 2014-03-13 ENCOUNTER — Ambulatory Visit: Payer: BC Managed Care – PPO | Admitting: Adult Health

## 2014-04-02 ENCOUNTER — Telehealth: Payer: Self-pay | Admitting: Adult Health

## 2014-04-02 NOTE — Telephone Encounter (Signed)
Left message I called 

## 2014-04-03 ENCOUNTER — Telehealth: Payer: Self-pay | Admitting: Adult Health

## 2014-04-03 NOTE — Telephone Encounter (Signed)
Bleeding again refill megace and call back in 2 weeks may can switch to errin then

## 2014-04-06 NOTE — Telephone Encounter (Signed)
Krista [name] spoke with pt. Encounter closed. [name]

## 2014-04-20 ENCOUNTER — Encounter: Payer: Self-pay | Admitting: Adult Health

## 2014-05-08 ENCOUNTER — Ambulatory Visit (INDEPENDENT_AMBULATORY_CARE_PROVIDER_SITE_OTHER): Payer: BC Managed Care – PPO | Admitting: Obstetrics & Gynecology

## 2014-05-08 ENCOUNTER — Encounter: Payer: Self-pay | Admitting: Obstetrics & Gynecology

## 2014-05-08 VITALS — BP 120/80 | Ht 69.2 in | Wt 264.0 lb

## 2014-05-08 DIAGNOSIS — N764 Abscess of vulva: Secondary | ICD-10-CM | POA: Diagnosis not present

## 2014-05-08 MED ORDER — SULFAMETHOXAZOLE-TRIMETHOPRIM 800-160 MG PO TABS: 1.0000 | ORAL_TABLET | Freq: Two times a day (BID) | ORAL | Status: DC

## 2014-05-08 NOTE — Progress Notes (Signed)
Patient ID: Krista [name], female   DOB: 1976/03/21, 38 y.o.   MRN: [id number] Chief Complaint  Patient presents with  . gyn visit    sore on vaginal area.    Pt presents complaining of a sore tender area on the left vulva over the past few days No fever chills  Never had before  No burning with urination, frequency or urgency No nausea, vomiting or diarrhea Nor fever chills or other constitutional symptoms  Blood pressure 120/80, height 5' 9.2" (1.758 m), weight 264 lb (119.75 kg), last menstrual period 04/21/2014.  Vulva left vulva with 2 cm abscess, no adenopathy some surrounding erythema  Left vulvar abscess,( new problem)  Bactrim DS BID x 10 days Follow up prn or if does not improve

## 2014-05-22 ENCOUNTER — Ambulatory Visit: Payer: BC Managed Care – PPO | Admitting: Obstetrics & Gynecology

## 2014-05-29 ENCOUNTER — Ambulatory Visit: Payer: BC Managed Care – PPO | Admitting: Obstetrics & Gynecology

## 2014-09-01 ENCOUNTER — Telehealth: Payer: Self-pay | Admitting: Adult Health

## 2014-09-01 MED ORDER — NORETHINDRONE 0.35 MG PO TABS: 1.0000 | ORAL_TABLET | Freq: Every day | ORAL | Status: DC

## 2014-09-01 NOTE — Telephone Encounter (Signed)
Refilled errin x 3

## 2014-09-01 NOTE — Telephone Encounter (Signed)
Spoke with pt. Pt is on Errin .35mg  and she needs a refill. Pt's last pap was 07/2012 so I advised she don't need a pap but does need a yearly exam. Pt will check schedule and call back to make appt. Thanks!! [place]

## 2014-10-06 ENCOUNTER — Other Ambulatory Visit: Payer: Self-pay | Admitting: Adult Health

## 2015-01-28 ENCOUNTER — Other Ambulatory Visit: Payer: Self-pay | Admitting: Adult Health

## 2015-03-09 ENCOUNTER — Telehealth: Payer: Self-pay | Admitting: Adult Health

## 2015-03-09 NOTE — Telephone Encounter (Signed)
Have bleeding again,will make appt

## 2015-03-15 ENCOUNTER — Ambulatory Visit (INDEPENDENT_AMBULATORY_CARE_PROVIDER_SITE_OTHER): Payer: BLUE CROSS/BLUE SHIELD | Admitting: Adult Health

## 2015-03-15 ENCOUNTER — Encounter: Payer: Self-pay | Admitting: Adult Health

## 2015-03-15 VITALS — BP 142/80 | HR 76 | Ht 69.5 in | Wt 298.5 lb

## 2015-03-15 DIAGNOSIS — Z139 Encounter for screening, unspecified: Secondary | ICD-10-CM

## 2015-03-15 DIAGNOSIS — E039 Hypothyroidism, unspecified: Secondary | ICD-10-CM | POA: Insufficient documentation

## 2015-03-15 DIAGNOSIS — N888 Other specified noninflammatory disorders of cervix uteri: Secondary | ICD-10-CM | POA: Diagnosis not present

## 2015-03-15 DIAGNOSIS — N926 Irregular menstruation, unspecified: Secondary | ICD-10-CM | POA: Diagnosis not present

## 2015-03-15 HISTORY — DX: Other specified noninflammatory disorders of cervix uteri: N88.8

## 2015-03-15 HISTORY — DX: Irregular menstruation, unspecified: N92.6

## 2015-03-15 MED ORDER — METRONIDAZOLE 0.75 % VA GEL: VAGINAL | Status: DC

## 2015-03-15 MED ORDER — MEGESTROL ACETATE 40 MG PO TABS: ORAL_TABLET | ORAL | Status: DC

## 2015-03-15 NOTE — Progress Notes (Signed)
Subjective:     Patient ID: Krista [name], female   DOB: 05/11/1976, 39 y.o.   MRN: [id number]  HPI Krista [name] is a 39 year old white female, married in complaining of irregular bleeding.Has used megace and it has slowed.  Review of Systems Patient denies any headaches, hearing loss, fatigue, blurred vision, shortness of breath, chest pain, abdominal pain, problems with bowel movements, urination, or intercourse. No joint pain or mood swings.Has tingling in hands and feet at times,+irregular bleeding Reviewed past medical,surgical, social and family history. Reviewed medications and allergies.     Objective:   Physical Exam BP 142/80 mmHg  Pulse 76  Ht 5' 9.5" (1.765 m)  Wt 298 lb 8 oz (135.399 kg)  BMI 43.46 kg/m2  LMP 02/25/2015 Skin warm and dry.Pelvic: external genitalia is normal in appearance no lesions, vagina: tan discharge without odor,urethra has no lesions or masses noted, cervix:everted at os and friable and bulbous, uterus: normal size, shape and contour, non tender, no masses felt, adnexa: no masses or tenderness noted. Bladder is non tender and no masses felt. Not ready for ablation or IUD, will continue megace.Discussed may need nerve conduction study, says had evaluation in past, but no study.Doctor who managed thyroid left, will check TSH and A1c now.   Daughter who is 9 started period, gave her handout by Krista [name] on getting your period. Assessment:     Irregular bleeding Friable cervix Hypothyroid     Plan:     Rx megace 40 mg #45 3 x 5 days then 2 x 5 days then 1 daily with 3 refill   Rx metrogel use 1 applicator in vagina at hs x 5 nights,with 1 refill Check A1c and TSH Return in 5 months for pap and physical

## 2015-03-15 NOTE — Patient Instructions (Signed)
Pap and physical in 5 months  Use metrogel 1 applicator in vagina for 5 night Continue megace

## 2015-03-16 ENCOUNTER — Telehealth: Payer: Self-pay | Admitting: Adult Health

## 2015-03-16 LAB — TSH: TSH: 0.758 u[IU]/mL (ref 0.450–4.500)

## 2015-03-16 LAB — HEMOGLOBIN A1C
ESTIMATED AVERAGE GLUCOSE: 128 mg/dL
Hgb A1c MFr Bld: 6.1 % — ABNORMAL HIGH (ref 4.8–5.6)

## 2015-03-16 NOTE — Telephone Encounter (Signed)
Left message about labs

## 2015-03-18 ENCOUNTER — Telehealth: Payer: Self-pay | Admitting: Adult Health

## 2015-03-18 NOTE — Telephone Encounter (Signed)
Left message I called, call back in am 

## 2015-03-22 ENCOUNTER — Telehealth: Payer: Self-pay | Admitting: Adult Health

## 2015-03-22 MED ORDER — NYSTATIN-TRIAMCINOLONE 100000-0.1 UNIT/GM-% EX CREA: 1.0000 "application " | TOPICAL_CREAM | Freq: Two times a day (BID) | CUTANEOUS | Status: DC

## 2015-03-22 NOTE — Telephone Encounter (Signed)
Has skin fungus under panniculus, will rx mytrex Also wants referral to [facility] for tingling in hands and feet, she spoke with Dr Aline Brochure and he said better to see them.

## 2015-03-23 ENCOUNTER — Encounter: Payer: Self-pay | Admitting: Adult Health

## 2015-03-23 NOTE — Progress Notes (Signed)
Per Anderson Malta request sent referral to [facility]. amp

## 2015-03-25 ENCOUNTER — Encounter: Payer: Self-pay | Admitting: Neurology

## 2015-03-25 ENCOUNTER — Ambulatory Visit (INDEPENDENT_AMBULATORY_CARE_PROVIDER_SITE_OTHER): Payer: BLUE CROSS/BLUE SHIELD | Admitting: Neurology

## 2015-03-25 VITALS — BP 162/80 | HR 72 | Resp 18 | Ht 69.5 in | Wt 301.0 lb

## 2015-03-25 DIAGNOSIS — R202 Paresthesia of skin: Secondary | ICD-10-CM | POA: Diagnosis not present

## 2015-03-25 DIAGNOSIS — R51 Headache: Secondary | ICD-10-CM

## 2015-03-25 DIAGNOSIS — R519 Headache, unspecified: Secondary | ICD-10-CM

## 2015-03-25 NOTE — Patient Instructions (Signed)
I am not sure what is causing your tingling.  I would like to investigate things further to look for evidence of neuropathy or nerve damage; therefore, I would like to do some blood work, and electrical testing of your muscles and nerves, which is known as EMG/NCV. Neuropathy or nerve disease or damage can be caused by a variety of causes, most commonly diabetes, some toxins including alcohol or metabolic derangements or hereditary disorders.   We will do a brain MRI as you have a history of headaches.
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
## 2015-03-26 NOTE — Progress Notes (Signed)
Quick Note:  pls call her: labs normal, including cell count and diff, electrolytes, liver and kidney function, vit B12 and folate, muscle enzyme called CK. Some results are not back yet, we will call to update. thx Star Age, MD, PhD [facility] ([facility])  ______

## 2015-03-29 ENCOUNTER — Telehealth: Payer: Self-pay

## 2015-03-29 LAB — CBC WITH DIFFERENTIAL/PLATELET
BASOS: 0 %
Basophils Absolute: 0 10*3/uL (ref 0.0–0.2)
EOS (ABSOLUTE): 0.1 10*3/uL (ref 0.0–0.4)
Eos: 2 %
HEMATOCRIT: 36.4 % (ref 34.0–46.6)
HEMOGLOBIN: 12.2 g/dL (ref 11.1–15.9)
IMMATURE GRANS (ABS): 0 10*3/uL (ref 0.0–0.1)
Immature Granulocytes: 0 %
LYMPHS: 28 %
Lymphocytes Absolute: 2.6 10*3/uL (ref 0.7–3.1)
MCH: 26.6 pg (ref 26.6–33.0)
MCHC: 33.5 g/dL (ref 31.5–35.7)
MCV: 79 fL (ref 79–97)
MONOCYTES: 6 %
Monocytes Absolute: 0.6 10*3/uL (ref 0.1–0.9)
NEUTROS ABS: 5.8 10*3/uL (ref 1.4–7.0)
Neutrophils: 64 %
Platelets: 278 10*3/uL (ref 150–379)
RBC: 4.59 x10E6/uL (ref 3.77–5.28)
RDW: 14.3 % (ref 12.3–15.4)
WBC: 9.2 10*3/uL (ref 3.4–10.8)

## 2015-03-29 LAB — COMPREHENSIVE METABOLIC PANEL
A/G RATIO: 1.5 (ref 1.1–2.5)
ALT: 28 IU/L (ref 0–32)
AST: 21 IU/L (ref 0–40)
Albumin: 4.2 g/dL (ref 3.5–5.5)
Alkaline Phosphatase: 52 IU/L (ref 39–117)
BILIRUBIN TOTAL: 0.2 mg/dL (ref 0.0–1.2)
BUN/Creatinine Ratio: 12 (ref 8–20)
BUN: 9 mg/dL (ref 6–20)
CALCIUM: 9.2 mg/dL (ref 8.7–10.2)
CHLORIDE: 104 mmol/L (ref 97–108)
CO2: 21 mmol/L (ref 18–29)
Creatinine, Ser: 0.76 mg/dL (ref 0.57–1.00)
GFR calc Af Amer: 116 mL/min/{1.73_m2} (ref >59)
GFR, EST NON AFRICAN AMERICAN: 101 mL/min/{1.73_m2} (ref >59)
GLUCOSE: 93 mg/dL (ref 65–99)
Globulin, Total: 2.8 g/dL (ref 1.5–4.5)
POTASSIUM: 3.5 mmol/L (ref 3.5–5.2)
Sodium: 142 mmol/L (ref 134–144)
Total Protein: 7 g/dL (ref 6.0–8.5)

## 2015-03-29 LAB — MULTIPLE MYELOMA PANEL, SERUM
ALBUMIN/GLOB SERPL: 1.3 (ref 0.7–1.7)
Albumin SerPl Elph-Mcnc: 3.9 g/dL (ref 2.9–4.4)
Alpha 1: 0.2 g/dL (ref 0.0–0.4)
Alpha2 Glob SerPl Elph-Mcnc: 0.8 g/dL (ref 0.4–1.0)
B-GLOBULIN SERPL ELPH-MCNC: 1.1 g/dL (ref 0.7–1.3)
Gamma Glob SerPl Elph-Mcnc: 0.9 g/dL (ref 0.4–1.8)
Globulin, Total: 3.1 g/dL (ref 2.2–3.9)
IGA/IMMUNOGLOBULIN A, SERUM: 214 mg/dL (ref 87–352)
IGM (IMMUNOGLOBULIN M), SRM: 106 mg/dL (ref 26–217)
IgG (Immunoglobin G), Serum: 864 mg/dL (ref 700–1600)

## 2015-03-29 LAB — HEAVY METALS PROFILE II, BLOOD
ARSENIC: 6 ug/L (ref 2–23)
CADMIUM: NOT DETECTED ug/L (ref 0.0–1.2)
Lead, Blood: NOT DETECTED ug/dL (ref 0–19)
MERCURY: NOT DETECTED ug/L (ref 0.0–14.9)

## 2015-03-29 LAB — ANA W/REFLEX: Anti Nuclear Antibody(ANA): NEGATIVE

## 2015-03-29 LAB — B12 AND FOLATE PANEL
FOLATE: 17.9 ng/mL (ref >3.0)
Vitamin B-12: 597 pg/mL (ref 211–946)

## 2015-03-29 LAB — CK: Total CK: 135 U/L (ref 24–173)

## 2015-03-29 LAB — VITAMIN B1: Thiamine: 222.2 nmol/L — ABNORMAL HIGH (ref 66.5–200.0)

## 2015-03-29 LAB — RPR: RPR: NONREACTIVE

## 2015-03-29 LAB — VITAMIN B6: Vitamin B6: >100 ug/L — ABNORMAL HIGH (ref 2.0–32.8)

## 2015-03-29 NOTE — Progress Notes (Signed)
Quick Note:  Vitamins B6 and B1 elevated. Pls call pt: Is she taking a B vit. Supplement? If so, she should stop it. Pls inquire.  Star Age, MD, PhD [facility] ([facility]  ______

## 2015-03-29 NOTE — Telephone Encounter (Signed)
-----   Message from Star Age, MD sent at 03/26/2015 10:19 AM EDT ----- pls call her: labs normal, including cell count and diff, electrolytes, liver and kidney function, vit B12 and  folate, muscle enzyme called CK. Some results are not back yet, we will call to update. thx Star Age, MD, PhD [facility])

## 2015-03-29 NOTE — Telephone Encounter (Signed)
I spoke to patient and advised her of results below. She understands that we will call back with remaining results.

## 2015-03-31 ENCOUNTER — Telehealth: Payer: Self-pay

## 2015-03-31 NOTE — Telephone Encounter (Signed)
I spoke to patient and she is aware of results. Krista [name] reports that she is taking a vit B supplement and understands Dr. Guadelupe Sabin recommendation of stopping vitamin. She will f/u with additional testing that is scheduled.

## 2015-03-31 NOTE — Telephone Encounter (Signed)
-----   Message from Star Age, MD sent at 03/29/2015  7:44 AM EDT ----- Vitamins B6 and B1 elevated. Pls call pt: Is she taking a B vit. Supplement? If so, she should stop it. Pls inquire.  Star Age, MD, PhD [facility])

## 2015-04-06 ENCOUNTER — Inpatient Hospital Stay: Admission: RE | Admit: 2015-04-06 | Payer: Self-pay | Source: Ambulatory Visit

## 2015-04-14 ENCOUNTER — Encounter: Payer: BLUE CROSS/BLUE SHIELD | Admitting: Neurology

## 2015-04-19 ENCOUNTER — Other Ambulatory Visit: Payer: Self-pay

## 2015-04-27 ENCOUNTER — Inpatient Hospital Stay: Admission: RE | Admit: 2015-04-27 | Payer: Self-pay | Source: Ambulatory Visit

## 2015-05-11 ENCOUNTER — Telehealth: Payer: Self-pay | Admitting: Adult Health

## 2015-05-11 NOTE — Telephone Encounter (Signed)
Pt called stating that she would like a call back from Xenia  regarding a medication refill that she needs. Please contact pt

## 2015-05-11 NOTE — Telephone Encounter (Signed)
Had question about amour thyroid refill,I can refill but should see MD that prescribed it and if he is oK with  It I can then refill

## 2015-05-12 ENCOUNTER — Encounter: Payer: Self-pay | Admitting: Neurology

## 2015-05-20 ENCOUNTER — Encounter: Payer: BLUE CROSS/BLUE SHIELD | Admitting: Neurology

## 2015-06-13 ENCOUNTER — Other Ambulatory Visit: Payer: Self-pay | Admitting: Adult Health

## 2015-06-28 ENCOUNTER — Ambulatory Visit: Payer: BLUE CROSS/BLUE SHIELD | Admitting: Neurology

## 2015-07-18 ENCOUNTER — Other Ambulatory Visit: Payer: Self-pay | Admitting: Adult Health

## 2015-08-17 ENCOUNTER — Other Ambulatory Visit: Payer: BLUE CROSS/BLUE SHIELD | Admitting: Adult Health

## 2015-08-31 ENCOUNTER — Ambulatory Visit (INDEPENDENT_AMBULATORY_CARE_PROVIDER_SITE_OTHER): Payer: BLUE CROSS/BLUE SHIELD | Admitting: Adult Health

## 2015-08-31 ENCOUNTER — Encounter: Payer: Self-pay | Admitting: Adult Health

## 2015-08-31 ENCOUNTER — Other Ambulatory Visit (HOSPITAL_COMMUNITY)
Admission: RE | Admit: 2015-08-31 | Discharge: 2015-08-31 | Disposition: A | Discharge status: Home / Self Care | Payer: BLUE CROSS/BLUE SHIELD | Source: Ambulatory Visit | Attending: Adult Health | Admitting: Adult Health

## 2015-08-31 VITALS — BP 148/90 | HR 60 | Ht 68.25 in | Wt 315.5 lb

## 2015-08-31 DIAGNOSIS — Z01419 Encounter for gynecological examination (general) (routine) without abnormal findings: Secondary | ICD-10-CM | POA: Diagnosis present

## 2015-08-31 DIAGNOSIS — R635 Abnormal weight gain: Secondary | ICD-10-CM

## 2015-08-31 DIAGNOSIS — N926 Irregular menstruation, unspecified: Secondary | ICD-10-CM

## 2015-08-31 DIAGNOSIS — I10 Essential (primary) hypertension: Secondary | ICD-10-CM

## 2015-08-31 DIAGNOSIS — Z1151 Encounter for screening for human papillomavirus (HPV): Secondary | ICD-10-CM | POA: Diagnosis present

## 2015-08-31 DIAGNOSIS — D259 Leiomyoma of uterus, unspecified: Secondary | ICD-10-CM

## 2015-08-31 DIAGNOSIS — N888 Other specified noninflammatory disorders of cervix uteri: Secondary | ICD-10-CM

## 2015-08-31 DIAGNOSIS — E039 Hypothyroidism, unspecified: Secondary | ICD-10-CM

## 2015-08-31 HISTORY — DX: Abnormal weight gain: R63.5

## 2015-08-31 NOTE — Patient Instructions (Signed)
Physical in 1 year, pap in 3 if normal Mammogram at 40 See Dr Judeen Hammans in follow up

## 2015-08-31 NOTE — Progress Notes (Signed)
Patient ID: Krista [name], female   DOB: 03-Sep-1975, 40 y.o.   MRN: [id number] History of Present Illness: Krista [name] is a 40 year old white female, married, in for well woman gyn exam and pap.She is seeing Dr. Eliezer Bottom in [place] and needs labs today, he does homeopathic medicine.She complains of weight gain, tingling in hands and feet and she has seen neurologists and had no + findings.She says hips ache, she says she is not in alignment, and has irregular but if takes megace it stops, has known fibroids.She says she has no motivation at times.  Current Medications, Allergies, Past Medical History, Past Surgical History, Family History and Social History were reviewed in [employer] record.     Review of Systems: Patient denies any headaches, hearing loss, fatigue, blurred vision, shortness of breath, chest pain, abdominal pain, problems with bowel movements, urination, or intercourse. No joint pain or mood swings.See HPI for positives.    Physical Exam:BP 148/90 mmHg  Pulse 60  Ht 5' 8.25" (1.734 m)  Wt 315 lb 8 oz (143.11 kg)  BMI 47.60 kg/m2  LMP 07/09/2015 General:  Well developed, well nourished, no acute distress Skin:  Warm and dry Neck:  Midline trachea, normal thyroid, good ROM, no lymphadenopathy Lungs; Clear to auscultation bilaterally Breast:  No dominant palpable mass, retraction, or nipple discharge Cardiovascular: Regular rate and rhythm Abdomen:  Soft, non tender, no hepatosplenomegaly,obese Pelvic:  External genitalia is normal in appearance, no lesions.  The vagina is normal in appearance. Urethra has no lesions or masses. The cervix is bulbous. Pap with HPV performed and cervix is friable. Uterus is felt to be slightly enlarged, but difficult secondary to abdominal girth.  No adnexal masses or tenderness noted.Bladder is non tender, no masses felt. Extremities/musculoskeletal:  No swelling or varicosities noted, no clubbing or  cyanosis Psych:  No mood changes, alert and cooperative,seems happy Discussed she will probably needs meds for hypertension and hypothyroid, but she wants Dr Eliezer Bottom to possibly treat.  Impression: Well woman gyn exam and pap Friable cervix Fibroids Hypothyroidism  Hypertension Weight gain Irregular bleeding     Plan: Getting labs today for Dr Eliezer Bottom at [facility] in [place], Phone [phone], and will follow up with him, will get me a copy of labs and we will talk after she sees him. Physical in 1 year, pap in 3 if normal Mammogram at 42

## 2015-09-02 ENCOUNTER — Other Ambulatory Visit: Payer: Self-pay | Admitting: *Deleted

## 2015-09-02 LAB — CYTOLOGY - PAP

## 2015-09-06 ENCOUNTER — Telehealth: Payer: Self-pay | Admitting: Adult Health

## 2015-09-06 NOTE — Telephone Encounter (Signed)
Left message I called, call me back if needed

## 2015-09-07 MED ORDER — MEGESTROL ACETATE 40 MG PO TABS: ORAL_TABLET | ORAL | Status: DC

## 2015-09-07 NOTE — Telephone Encounter (Signed)
Needs refill on megace, she is making appt to discuss depression and anxiety 3/31 at 11 am.will refill megace

## 2015-09-17 ENCOUNTER — Ambulatory Visit (INDEPENDENT_AMBULATORY_CARE_PROVIDER_SITE_OTHER): Payer: BLUE CROSS/BLUE SHIELD | Admitting: Adult Health

## 2015-09-17 ENCOUNTER — Encounter: Payer: Self-pay | Admitting: Adult Health

## 2015-09-17 VITALS — BP 168/90 | HR 100 | Ht 68.5 in | Wt 315.5 lb

## 2015-09-17 DIAGNOSIS — I10 Essential (primary) hypertension: Secondary | ICD-10-CM | POA: Diagnosis not present

## 2015-09-17 DIAGNOSIS — E669 Obesity, unspecified: Secondary | ICD-10-CM

## 2015-09-17 DIAGNOSIS — F419 Anxiety disorder, unspecified: Secondary | ICD-10-CM | POA: Diagnosis not present

## 2015-09-17 DIAGNOSIS — F329 Major depressive disorder, single episode, unspecified: Secondary | ICD-10-CM | POA: Diagnosis not present

## 2015-09-17 DIAGNOSIS — F32A Depression, unspecified: Secondary | ICD-10-CM

## 2015-09-17 DIAGNOSIS — N946 Dysmenorrhea, unspecified: Secondary | ICD-10-CM | POA: Diagnosis not present

## 2015-09-17 HISTORY — DX: Dysmenorrhea, unspecified: N94.6

## 2015-09-17 MED ORDER — LISINOPRIL 10 MG PO TABS: 10.0000 mg | ORAL_TABLET | Freq: Every day | ORAL | Status: DC

## 2015-09-17 MED ORDER — NAPROXEN SODIUM 275 MG PO TABS: ORAL_TABLET | ORAL | Status: DC

## 2015-09-17 MED ORDER — BUPROPION HCL ER (SR) 150 MG PO TB12: 150.0000 mg | ORAL_TABLET | Freq: Every day | ORAL | Status: DC

## 2015-09-17 MED ORDER — ALPRAZOLAM 0.5 MG PO TABS: 0.5000 mg | ORAL_TABLET | Freq: Two times a day (BID) | ORAL | Status: DC | PRN

## 2015-09-17 NOTE — Progress Notes (Signed)
Subjective:     Patient ID: Krista [name], female   DOB: [birth year], 40 y.o.   MRN: [id number]  HPI Krista [name] is a 40 year old white female, married, in complaining of anxiety and depression, "just wants to lay in the bed sometimes, she does not feel like her self,denies being suicidal. Had labs with MD in [place] and has them with her today.She complains of bad period cramps. She says she knows she needs to lose weight too.  Review of Systems Patient denies any headaches, hearing loss, fatigue, blurred vision, shortness of breath, chest pain, abdominal pain, problems with bowel movements, urination, or intercourse. No joint pain, see HPI for positives. Reviewed past medical,surgical, social and family history. Reviewed medications and allergies.     Objective:   Physical Exam BP 168/90 mmHg  Pulse 100  Ht 5' 8.5" (1.74 m)  Wt 315 lb 8 oz (143.11 kg)  BMI 47.27 kg/m2  LMP 09/16/2015 Skin warm and dry. Neck: mid line trachea, normal thyroid, good ROM, no lymphadenopathy noted. Lungs: clear to ausculation bilaterally. Cardiovascular: regular rate and rhythm.PHQ 9 score 16, discussed trying medication for depression and need to monitor blood sugars with range 100-140 optimal but always below 180, and increase walking and cut carbs and take time for self, reviewed labs with her that she had from MD in [place], A1c 7.6 and needs to be below 7. She is having infusion with him in near future of vitamins.  Face time 25 minutes with 50 % counseling and answering questions.     Assessment:     Depression Anxiety Hypertension Period cramps  Obesity  Diabetes     Plan:     Rx Wellbutrin SR  150 mg take 1 daily #30 with 3 refills Rx xanax 0.5 mg #30 take 1 bid prn anxiety no refills Rx anaprox 275 mg #60 take 2 every 8-12 hours prn cramps with 1 refill   Rx lisinopril 10 mg #30 take 1 daily with 3 refills Follow up in 6 weeks  Follow up with MD in [place] about blood sugars

## 2015-09-17 NOTE — Patient Instructions (Signed)
Decrease white foods Blood Glucose Monitoring, Adult Monitoring your blood glucose (also know as blood sugar) helps you to manage your diabetes. It also helps you and your health care provider monitor your diabetes and determine how well your treatment plan is working. WHY SHOULD YOU MONITOR YOUR BLOOD GLUCOSE?  It can help you understand how food, exercise, and medicine affect your blood glucose.  It allows you to know what your blood glucose is at any given moment. You can quickly tell if you are having low blood glucose (hypoglycemia) or high blood glucose (hyperglycemia).  It can help you and your health care provider know how to adjust your medicines.  It can help you understand how to manage an illness or adjust medicine for exercise. WHEN SHOULD YOU TEST? Your health care provider will help you decide how often you should check your blood glucose. This may depend on the type of diabetes you have, your diabetes control, or the types of medicines you are taking. Be sure to write down all of your blood glucose readings so that this information can be reviewed with your health care provider. See below for examples of testing times that your health care provider may suggest. Type 1 Diabetes  Test at least 2 times per day if your diabetes is well controlled, if you are using an insulin pump, or if you perform multiple daily injections.  If your diabetes is not well controlled or if you are sick, you may need to test more often.  It is a good idea to also test:  Before every insulin injection.  Before and after exercise.  Between meals and 2 hours after a meal.  Occasionally between 2:00 a.m. and 3:00 a.m. Type 2 Diabetes  If you are taking insulin, test at least 2 times per day. However, it is best to test before every insulin injection.  If you take medicines by mouth (orally), test 2 times a day.  If you are on a controlled diet, test once a day.  If your diabetes is not well  controlled or if you are sick, you may need to monitor more often. HOW TO MONITOR YOUR BLOOD GLUCOSE Supplies Needed  Blood glucose meter.  Test strips for your meter. Each meter has its own strips. You must use the strips that go with your own meter.  A pricking needle (lancet).  A device that holds the lancet (lancing device).  A journal or log book to write down your results. Procedure  Wash your hands with soap and water. Alcohol is not preferred.  Prick the side of your finger (not the tip) with the lancet.  Gently milk the finger until a small drop of blood appears.  Follow the instructions that come with your meter for inserting the test strip, applying blood to the strip, and using your blood glucose meter. Other Areas to Get Blood for Testing Some meters allow you to use other areas of your body (other than your finger) to test your blood. These areas are called alternative sites. The most common alternative sites are:  The forearm.  The thigh.  The back area of the lower leg.  The palm of the hand. The blood flow in these areas is slower. Therefore, the blood glucose values you get may be delayed, and the numbers are different from what you would get from your fingers. Do not use alternative sites if you think you are having hypoglycemia. Your reading will not be accurate. Always use a finger  if you are having hypoglycemia. Also, if you cannot feel your lows (hypoglycemia unawareness), always use your fingers for your blood glucose checks. ADDITIONAL TIPS FOR GLUCOSE MONITORING  Do not reuse lancets.  Always carry your supplies with you.  All blood glucose meters have a 24-hour "hotline" number to call if you have questions or need help.  Adjust (calibrate) your blood glucose meter with a control solution after finishing a few boxes of strips. BLOOD GLUCOSE RECORD KEEPING It is a good idea to keep a daily record or log of your blood glucose readings. Most glucose  meters, if not all, keep your glucose records stored in the meter. Some meters come with the ability to download your records to your home computer. Keeping a record of your blood glucose readings is especially helpful if you are wanting to look for patterns. Make notes to go along with the blood glucose readings because you might forget what happened at that exact time. Keeping good records helps you and your health care provider to work together to achieve good diabetes management.    This information is not intended to replace advice given to you by your health care provider. Make sure you discuss any questions you have with your health care provider.   Document Released: 06/08/2003 Document Revised: 06/26/2014 Document Reviewed: 10/28/2012 Elsevier Interactive Patient Education 2016 [employer]. Blood sugar range 100-140, keep below 180 Eat regularly Follow up in 6 weeks Take wellbutrin  Major Depressive Disorder Major depressive disorder is a mental illness. It also may be called clinical depression or unipolar depression. Major depressive disorder usually causes feelings of sadness, hopelessness, or helplessness. Some people with this disorder do not feel particularly sad but lose interest in doing things they used to enjoy (anhedonia). Major depressive disorder also can cause physical symptoms. It can interfere with work, school, relationships, and other normal everyday activities. The disorder varies in severity but is longer lasting and more serious than the sadness we all feel from time to time in our lives. Major depressive disorder often is triggered by stressful life events or major life changes. Examples of these triggers include divorce, loss of your job or home, a move, and the death of a family member or close friend. Sometimes this disorder occurs for no obvious reason at all. People who have family members with major depressive disorder or bipolar disorder are at higher risk for  developing this disorder, with or without life stressors. Major depressive disorder can occur at any age. It may occur just once in your life (single episode major depressive disorder). It may occur multiple times (recurrent major depressive disorder). SYMPTOMS People with major depressive disorder have either anhedonia or depressed mood on nearly a daily basis for at least 2 weeks or longer. Symptoms of depressed mood include:  Feelings of sadness (blue or down in the dumps) or emptiness.  Feelings of hopelessness or helplessness.  Tearfulness or episodes of crying (may be observed by others).  Irritability (children and adolescents). In addition to depressed mood or anhedonia or both, people with this disorder have at least four of the following symptoms:  Difficulty sleeping or sleeping too much.   Significant change (increase or decrease) in appetite or weight.   Lack of energy or motivation.  Feelings of guilt and worthlessness.   Difficulty concentrating, remembering, or making decisions.  Unusually slow movement (psychomotor retardation) or restlessness (as observed by others).   Recurrent wishes for death, recurrent thoughts of self-harm (suicide), or a  suicide attempt. People with major depressive disorder commonly have persistent negative thoughts about themselves, other people, and the world. People with severe major depressive disorder may experiencedistorted beliefs or perceptions about the world (psychotic delusions). They also may see or hear things that are not real (psychotic hallucinations). DIAGNOSIS Major depressive disorder is diagnosed through an assessment by your health care provider. Your health care provider will ask aboutaspects of your daily life, such as mood,sleep, and appetite, to see if you have the diagnostic symptoms of major depressive disorder. Your health care provider may ask about your medical history and use of alcohol or drugs, including  prescription medicines. Your health care provider also may do a physical exam and blood work. This is because certain medical conditions and the use of certain substances can cause major depressive disorder-like symptoms (secondary depression). Your health care provider also may refer you to a mental health specialist for further evaluation and treatment. TREATMENT It is important to recognize the symptoms of major depressive disorder and seek treatment. The following treatments can be prescribed for this disorder:   Medicine. Antidepressant medicines usually are prescribed. Antidepressant medicines are thought to correct chemical imbalances in the brain that are commonly associated with major depressive disorder. Other types of medicine may be added if the symptoms do not respond to antidepressant medicines alone or if psychotic delusions or hallucinations occur.  Talk therapy. Talk therapy can be helpful in treating major depressive disorder by providing support, education, and guidance. Certain types of talk therapy also can help with negative thinking (cognitive behavioral therapy) and with relationship issues that trigger this disorder (interpersonal therapy). A mental health specialist can help determine which treatment is best for you. Most people with major depressive disorder do well with a combination of medicine and talk therapy. Treatments involving electrical stimulation of the brain can be used in situations with extremely severe symptoms or when medicine and talk therapy do not work over time. These treatments include electroconvulsive therapy, transcranial magnetic stimulation, and vagal nerve stimulation.   This information is not intended to replace advice given to you by your health care provider. Make sure you discuss any questions you have with your health care provider.   Document Released: 09/30/2012 Document Revised: 06/26/2014 Document Reviewed: 09/30/2012 Elsevier Interactive  Patient Education [employer].

## 2015-10-07 ENCOUNTER — Other Ambulatory Visit: Payer: Self-pay | Admitting: Adult Health

## 2015-10-07 MED ORDER — MEGESTROL ACETATE 40 MG PO TABS: 40.0000 mg | ORAL_TABLET | Freq: Every day | ORAL | Status: DC

## 2015-10-20 ENCOUNTER — Ambulatory Visit (INDEPENDENT_AMBULATORY_CARE_PROVIDER_SITE_OTHER): Payer: BLUE CROSS/BLUE SHIELD | Admitting: Adult Health

## 2015-10-20 ENCOUNTER — Encounter: Payer: Self-pay | Admitting: Adult Health

## 2015-10-20 VITALS — BP 140/92 | HR 82 | Ht 69.0 in | Wt 311.0 lb

## 2015-10-20 DIAGNOSIS — F329 Major depressive disorder, single episode, unspecified: Secondary | ICD-10-CM | POA: Diagnosis not present

## 2015-10-20 DIAGNOSIS — B369 Superficial mycosis, unspecified: Secondary | ICD-10-CM

## 2015-10-20 DIAGNOSIS — F32A Depression, unspecified: Secondary | ICD-10-CM

## 2015-10-20 DIAGNOSIS — F419 Anxiety disorder, unspecified: Secondary | ICD-10-CM | POA: Diagnosis not present

## 2015-10-20 HISTORY — DX: Superficial mycosis, unspecified: B36.9

## 2015-10-20 MED ORDER — BUPROPION HCL ER (SR) 150 MG PO TB12: 150.0000 mg | ORAL_TABLET | Freq: Two times a day (BID) | ORAL | Status: DC

## 2015-10-20 MED ORDER — ALPRAZOLAM 0.5 MG PO TABS: 0.5000 mg | ORAL_TABLET | Freq: Three times a day (TID) | ORAL | Status: DC | PRN

## 2015-10-20 MED ORDER — KETOCONAZOLE 2 % EX CREA: 1.0000 "application " | TOPICAL_CREAM | Freq: Two times a day (BID) | CUTANEOUS | Status: DC

## 2015-10-20 NOTE — Progress Notes (Signed)
Subjective:     Patient ID: Krista [name], female   DOB: January 15, 1976, 40 y.o.   MRN: [id number]  HPI Krista [name] is a 40 year old white female, back in follow up of anxiety and depression,she says it is better but thinks meds need increasing, still feels overwhelmed at times, and trouble sleeping at times.Has ?skin fungus on foot, that itches.  Review of Systems  + itching on foot, ?fungus, + anxiety and depression, all other systems negative  Reviewed past medical,surgical, social and family history. Reviewed medications and allergies.     Objective:   Physical Exam BP 140/92 mmHg  Pulse 82  Ht 5\' 9"  (1.753 m)  Wt 311 lb (141.069 kg)  BMI 45.91 kg/m2  LMP 09/16/2015 Skin warm and dry. Lungs: clear to ausculation bilaterally. Cardiovascular: regular rate and rhythm. Has scaly red patch left foot, that itches, can't see dermatologist til end of June.   Discussed increasing Wellbutrin to bid and xanax to tid prn and she agrees.  Assessment:     Anxiety Depression Skin fungus     Plan:     Rx Wellbutrin 150 mg SR #60 take 1 bid with 3 refills Rx xanax 0.5 mg #60 1 tid prn with 1 refill Rx nizoral cream use bid with 1 refill, keep feet dry, and see dermatologist in June Follow up in 6 weeks or before if needed

## 2015-10-20 NOTE — Patient Instructions (Signed)
Keep feet dry  Try nizoral Follow up in 6 weeks Increase wellbutrin to bid

## 2015-10-26 ENCOUNTER — Ambulatory Visit: Payer: BLUE CROSS/BLUE SHIELD | Admitting: Adult Health

## 2015-10-29 ENCOUNTER — Ambulatory Visit: Payer: BLUE CROSS/BLUE SHIELD | Admitting: Adult Health

## 2015-11-04 ENCOUNTER — Telehealth: Payer: Self-pay | Admitting: Adult Health

## 2015-11-04 NOTE — Telephone Encounter (Signed)
Bleeding heavy take 3 megace now and 3 in am and call in am to see if slowed

## 2015-11-16 ENCOUNTER — Telehealth: Payer: Self-pay | Admitting: Adult Health

## 2015-11-16 NOTE — Telephone Encounter (Signed)
Spoke with pt. Pt was having heavy bleeding and was told to take 3 Megace in the am and 3 Megace in the pm. Pt has been doing this but is still bleeding like a normal period. Pt has been bleeding since May 8. Pt is supposed to go to the beach June 8. Pt has scheduled an appt with you on Thursday. Thanks!! [place]

## 2015-11-16 NOTE — Telephone Encounter (Signed)
still bleeding with megace, make appt with Dr Wonda Cheng have exhausted my options, even suggested IUD, or surgery, she is not there yet but wants bleeding to stop

## 2015-11-18 ENCOUNTER — Encounter: Payer: Self-pay | Admitting: *Deleted

## 2015-11-18 ENCOUNTER — Ambulatory Visit (INDEPENDENT_AMBULATORY_CARE_PROVIDER_SITE_OTHER): Payer: BLUE CROSS/BLUE SHIELD | Admitting: *Deleted

## 2015-11-18 ENCOUNTER — Ambulatory Visit: Payer: BLUE CROSS/BLUE SHIELD | Admitting: Adult Health

## 2015-11-18 ENCOUNTER — Ambulatory Visit (INDEPENDENT_AMBULATORY_CARE_PROVIDER_SITE_OTHER): Payer: BLUE CROSS/BLUE SHIELD | Admitting: Obstetrics & Gynecology

## 2015-11-18 ENCOUNTER — Encounter: Payer: Self-pay | Admitting: Obstetrics & Gynecology

## 2015-11-18 VITALS — BP 130/70 | HR 76 | Ht 69.2 in | Wt 312.0 lb

## 2015-11-18 DIAGNOSIS — N920 Excessive and frequent menstruation with regular cycle: Secondary | ICD-10-CM

## 2015-11-18 DIAGNOSIS — Z30013 Encounter for initial prescription of injectable contraceptive: Secondary | ICD-10-CM | POA: Diagnosis not present

## 2015-11-18 DIAGNOSIS — Z3202 Encounter for pregnancy test, result negative: Secondary | ICD-10-CM

## 2015-11-18 DIAGNOSIS — N921 Excessive and frequent menstruation with irregular cycle: Secondary | ICD-10-CM

## 2015-11-18 DIAGNOSIS — D500 Iron deficiency anemia secondary to blood loss (chronic): Secondary | ICD-10-CM

## 2015-11-18 LAB — POCT HEMOGLOBIN: Hemoglobin: 10.8 g/dL — AB (ref 12.2–16.2)

## 2015-11-18 LAB — POCT URINE PREGNANCY: PREG TEST UR: NEGATIVE

## 2015-11-18 MED ORDER — KETOROLAC TROMETHAMINE 10 MG PO TABS: 10.0000 mg | ORAL_TABLET | Freq: Three times a day (TID) | ORAL | Status: DC | PRN

## 2015-11-18 MED ORDER — MEDROXYPROGESTERONE ACETATE 150 MG/ML IM SUSP
300.0000 mg | Freq: Once | INTRAMUSCULAR | Status: AC
  Administered 2015-11-18: 300 mg via INTRAMUSCULAR

## 2015-11-18 MED ORDER — MEGESTROL ACETATE 40 MG PO TABS: 40.0000 mg | ORAL_TABLET | Freq: Every day | ORAL | Status: DC

## 2015-11-18 MED ORDER — MEDROXYPROGESTERONE ACETATE 150 MG/ML IM SUSP: 150.0000 mg | Freq: Once | INTRAMUSCULAR | Status: DC

## 2015-11-18 NOTE — Progress Notes (Signed)
Patient ID: Krista [name], female   DOB: 29-Aug-1975, 40 y.o.   MRN: [id number]      Chief Complaint  Patient presents with  . gyn visit    vaginal bleeding/ heavy and long since May 8    Blood pressure 130/70, pulse 76, height 5' 9.2" (1.758 m), weight (!) 312 lb (141.5 kg), last menstrual period 10/25/2015.  40 y.o. [id number] Patient's last menstrual period was 10/25/2015. The current method of family planning is oral progesterone-only contraceptive.  Subjective Pt having prolonged heavy bleeding clots heavy cramping essentially daily even on megestrol Has long history of dyssynchronous vaginal bleeding no past hisotry of uterine pathology, although sonogram has not been done for some time  Objective Gen obese female NAD Vulva:  normal appearing vulva with no masses, tenderness or lesions Vagina:  normal mucosa, scant blood Cervix:  no cervical motion tenderness and no lesions Uterus:  normal size, contour, position, consistency, mobility, non-tender Adnexa: ovaries:present,  normal adnexa in size, nontender and no masses   Pertinent ROS No burning with urination, frequency or urgency No nausea, vomiting or diarrhea Nor fever chills or other constitutional symptoms   Labs or studies     Impression Diagnoses this Encounter::   ICD-9-CM ICD-10-CM   1. Menometrorrhagia 626.2 N92.1 US Pelvis Complete     US Transvaginal Non-OB  2. Menorrhagia with regular cycle 626.2 N92.0 POCT hemoglobin  3. Anemia due to chronic blood loss 280.0 D50.0 US Pelvis Complete     US Transvaginal Non-OB    Established relevant diagnosis(es):   Plan/Recommendations: Meds ordered this encounter  Medications  . DISCONTD: medroxyPROGESTERone (DEPO-PROVERA) 150 MG/ML injection    Sig: Inject 1 mL (150 mg total) into the muscle once.    Dispense:  2 mL    Refill:  3    Please give 2 vials treating menometrorrhagia  . DISCONTD: megestrol (MEGACE) 40 MG tablet    Sig: Take 1 tablet  (40 mg total) by mouth daily.    Dispense:  60 tablet    Refill:  11  . DISCONTD: ketorolac (TORADOL) 10 MG tablet    Sig: Take 1 tablet (10 mg total) by mouth every 8 (eight) hours as needed.    Dispense:  15 tablet    Refill:  0    Labs or Scans Ordered: Orders Placed This Encounter  Procedures  . US Pelvis Complete  . US Transvaginal Non-OB  . POCT hemoglobin    Management:: 300 depo + megestrol Evaluate endometrium with sonogram Consider surgical management if cannot manage effectively otherwise  Follow up Return in about 2 weeks (around 12/02/2015) for GYN sono, Follow up, with Dr Elonda Husky.          All questions were answered.

## 2015-11-18 NOTE — Progress Notes (Signed)
Patient ID: Krista [name], female   DOB: 04/15/1976, 40 y.o.   MRN: [id number] Pt given injection and tolerated well.

## 2015-12-02 ENCOUNTER — Encounter: Payer: Self-pay | Admitting: Obstetrics & Gynecology

## 2015-12-02 ENCOUNTER — Ambulatory Visit (INDEPENDENT_AMBULATORY_CARE_PROVIDER_SITE_OTHER): Payer: BLUE CROSS/BLUE SHIELD | Admitting: Obstetrics & Gynecology

## 2015-12-02 ENCOUNTER — Ambulatory Visit (INDEPENDENT_AMBULATORY_CARE_PROVIDER_SITE_OTHER): Payer: BLUE CROSS/BLUE SHIELD

## 2015-12-02 ENCOUNTER — Other Ambulatory Visit: Payer: BLUE CROSS/BLUE SHIELD

## 2015-12-02 ENCOUNTER — Ambulatory Visit: Payer: BLUE CROSS/BLUE SHIELD | Admitting: Adult Health

## 2015-12-02 VITALS — BP 118/70 | HR 76 | Wt 315.0 lb

## 2015-12-02 DIAGNOSIS — N921 Excessive and frequent menstruation with irregular cycle: Secondary | ICD-10-CM

## 2015-12-02 DIAGNOSIS — D500 Iron deficiency anemia secondary to blood loss (chronic): Secondary | ICD-10-CM | POA: Diagnosis not present

## 2015-12-02 DIAGNOSIS — D259 Leiomyoma of uterus, unspecified: Secondary | ICD-10-CM | POA: Diagnosis not present

## 2015-12-02 NOTE — Progress Notes (Addendum)
US PELVIC US TA/TV: Heterogenous anteverted uterus w/mult fibroids,(#1) calcified post rt intramural fibroid 2 x 1.4 x 1.1cm, (#2) calcified intramural fibroid post lt  .5 x .4 x .4 cm,(#3) pedunculated rt body 2.5 x 2.9 x 2.1cm, EEC 11.9 mm,normal rt ov,limited view of left ovary

## 2015-12-02 NOTE — Progress Notes (Signed)
Patient ID: Krista [name], female   DOB: [birth year], 40 y.o.   MRN: [id number] Follow up appointment for results  Chief Complaint  Patient presents with  . Follow-up    ultrasound    Blood pressure 118/70, pulse 76, weight 315 lb (142.883 kg), last menstrual period 10/25/2015.  US Transvaginal Non-ob  12/02/2015  GYNECOLOGIC SONOGRAM Krista [name] is a 40 y.o. [id number] LMP 10/25/2015 for a pelvic sonogram for menometrorrhagia. Uterus                      10.6 x 6.2 x 6.4 cm, Heterogenous anteverted uterus w/mult fibroids Endometrium          11.9 mm, symmetrical, wnl Right ovary             2.9 x 2.2 x 1.6 cm, wnl Left ovary                2.2 x 1.5 x 2.1 cm, wnl No free fluid,no pain during ultrasound fibroids (#1) calcified post rt intramural fibroid 2 x 1.4 x 1.1cm, (#2) calcified intramural fibroid post lt  .5 x .4 x .4 cm,(#3) pedunculated rt body 2.5 x 2.9 x 2.1cm Technician Comments: US PELVIC US TA/TV: Heterogenous anteverted uterus w/mult fibroids,(#1) calcified post rt intramural fibroid 2 x 1.4 x 1.1cm, (#2) calcified intramural fibroid post lt  .5 x .4 x .4 cm,(#3) pedunculated rt body 2.5 x 2.9 x 2.1cm, EEC 11.9 mm,normal rt ov,limited view of left ov [employer] 12/02/2015 10:16 AM Clinical Impression and recommendations: I have reviewed the sonogram results above, combined with the patient's current clinical course, below are my impressions and any appropriate recommendations for management based on the sonographic findings. Multiple very small fibroids probably not contributing to the patient's bleeding Otherwise normal sonogram EURE,LUTHER H 12/02/2015 10:43 AM   US Pelvis Complete  12/02/2015  GYNECOLOGIC SONOGRAM Krista [name] is a 40 y.o. [id number] LMP 10/25/2015 for a pelvic sonogram for menometrorrhagia. Uterus                      10.6 x 6.2 x 6.4 cm, Heterogenous anteverted uterus w/mult fibroids Endometrium          11.9 mm, symmetrical, wnl Right ovary              2.9 x 2.2 x 1.6 cm, wnl Left ovary                2.2 x 1.5 x 2.1 cm, wnl No free fluid,no pain during ultrasound fibroids (#1) calcified post rt intramural fibroid 2 x 1.4 x 1.1cm, (#2) calcified intramural fibroid post lt  .5 x .4 x .4 cm,(#3) pedunculated rt body 2.5 x 2.9 x 2.1cm Technician Comments: US PELVIC US TA/TV: Heterogenous anteverted uterus w/mult fibroids,(#1) calcified post rt intramural fibroid 2 x 1.4 x 1.1cm, (#2) calcified intramural fibroid post lt  .5 x .4 x .4 cm,(#3) pedunculated rt body 2.5 x 2.9 x 2.1cm, EEC 11.9 mm,normal rt ov,limited view of left ov [employer] 12/02/2015 10:16 AM Clinical Impression and recommendations: I have reviewed the sonogram results above, combined with the patient's current clinical course, below are my impressions and any appropriate recommendations for management based on the sonographic findings. Multiple very small fibroids probably not contributing to the patient's bleeding Otherwise normal sonogram EURE,LUTHER H 12/02/2015 10:43 AM    Depo in place for 2.5 months more  [facility]S ordered this encounter: No  orders of the defined types were placed in this encounter.    Orders for this encounter: No orders of the defined types were placed in this encounter.    Plan: Discussed ablation, questions answered Will see back in 2 months  Follow Up:     Face to face time:  10 minutes  Greater than 50% of the visit time was spent in counseling and coordination of care with the patient.  The summary and outline of the counseling and care coordination is summarized in the note above.   All questions were answered.  Past Medical History  Diagnosis Date  . Depression   . Anxiety   . Allergy   . Asthma     childhood  . Rosacea   . Obesity   . Hypertension   . Hypothyroid   . Menometrorrhagia 02/11/2014  . Fibroids 02/13/2014  . Lyme disease   . Irregular bleeding 03/15/2015  . Friable cervix 03/15/2015  . Weight gain 08/31/2015  . Diabetes  mellitus without complication ([place])   . Severe menstrual cramps 09/17/2015  . Superficial fungus infection of skin 10/20/2015    Past Surgical History  Procedure Laterality Date  . Cesarean section    . Cholecystectomy    . Wisdom tooth extraction    . Tonsillectomy    . Ovarian cyst removal     . Ingrown toe nail removal       OB History    Gravida Para Term Preterm AB TAB SAB Ectopic Multiple Living   5 3   2  2   3       No Known Allergies  Social History   Social History  . Marital Status: Married    Spouse Name: N/A  . Number of Children: 3  . Years of Education: Assoc   Occupational History  . [employer]    Social History Main Topics  . Smoking status: Never Smoker   . Smokeless tobacco: Never Used  . Alcohol Use: 0.0 oz/week    0 Standard drinks or equivalent per week     Comment: rarely  . Drug Use: No  . Sexual Activity: Yes    Birth Control/ Protection: None, Condom   Other Topics Concern  . None   Social History Narrative   Drinks one soda a day     Family History  Problem Relation Age of Onset  . Diabetes Mother   . Hypertension Mother   . Heart attack Father   . Hypertension Father   . Depression Sister   . Diabetes Maternal Grandmother   . Coronary artery disease Maternal Grandmother   . Diabetes Maternal Grandfather   . Coronary artery disease Maternal Grandfather   . Coronary artery disease Paternal Grandmother   . Diabetes Paternal Grandmother   . Coronary artery disease Paternal Grandfather

## 2015-12-03 ENCOUNTER — Telehealth: Payer: Self-pay | Admitting: Obstetrics & Gynecology

## 2015-12-03 NOTE — Telephone Encounter (Signed)
Pt states she would like to proceed with Endometrial Ablation, would like to schedule surgery around the first of July if at all possible.

## 2015-12-06 NOTE — Telephone Encounter (Signed)
How bout 12/22/2015

## 2015-12-08 ENCOUNTER — Other Ambulatory Visit: Payer: Self-pay | Admitting: Adult Health

## 2015-12-08 NOTE — Telephone Encounter (Signed)
Endometrial Ablation scheduled for 12/22/2015 with Dr. Elonda Husky, pt informed will receive a call from our office in regards to her pre op at [facility].

## 2015-12-08 NOTE — Telephone Encounter (Signed)
Yes that would be fine  Get amy or dawn to schedule her

## 2015-12-14 NOTE — Patient Instructions (Signed)
Krista [name]  12/14/2015     @PREFPERIOPPHARMACY @   Your procedure is scheduled on  12/22/2015 .  Report to [facility] at  615   A.M.  Call this number if you have problems the morning of surgery:  [phone]   Remember:  Do not eat food or drink liquids after midnight.  Take these medicines the morning of surgery with A SIP OF WATER: xanax, wellbutrin, allegra, toradol, lisinopril.   Do not wear jewelry, make-up or nail polish.  Do not wear lotions, powders, or perfumes.  You may wear deoderant.  Do not shave 48 hours prior to surgery.  Men may shave face and neck.  Do not bring valuables to the hospital.  [facility] is not responsible for any belongings or valuables.  Contacts, dentures or bridgework may not be worn into surgery.  Leave your suitcase in the car.  After surgery it may be brought to your room.  For patients admitted to the hospital, discharge time will be determined by your treatment team.  Patients discharged the day of surgery will not be allowed to drive home.   Name and phone number of your driver:   family Special instructions:  none  Please read over the following fact sheets that you were given. Coughing and Deep Breathing, Surgical Site Infection Prevention, Anesthesia Post-op Instructions and Care and Recovery After Surgery      Endometrial Ablation Endometrial ablation removes the lining of the uterus (endometrium). It is usually a same-day, outpatient treatment. Ablation helps avoid major surgery, such as surgery to remove the cervix and uterus (hysterectomy). After endometrial ablation, you will have little or no menstrual bleeding and may not be able to have children. However, if you are premenopausal, you will need to use a reliable method of birth control following the procedure because of the small chance that pregnancy can occur. There are different reasons to have this procedure. These reasons  include:  Heavy periods.  Bleeding that is causing anemia.  Irregular bleeding.  Bleeding fibroids on the lining inside the uterus if they are smaller than 3 centimeters. This procedure may not be possible for you if:   You want to have children in the future.   You have severe cramps with your menstrual period.   You have precancerous or cancerous cells in your uterus.   You were recently pregnant.   You have gone through menopause.   You have had major surgery on your uterus, resulting in thinning of the uterine wall. Surgeries may include:  The removal of one or more uterine fibroids (myomectomy).  A cesarean section with a classic (vertical) incision on your uterus. Ask your health care provider what type of cesarean you had. Sometimes the scar on your skin is different than the scar on your uterus. Even if you have had surgery on your uterus, certain types of ablation may still be safe for you. Talk with your health care provider. LET [facility] CARE PROVIDER KNOW ABOUT:  Any allergies you have.  All medicines you are taking, including vitamins, herbs, eye drops, creams, and over-the-counter medicines.  Previous problems you or members of your family have had with the use of anesthetics.  Any blood disorders you have.  Previous surgeries you have had.  Medical conditions you have. RISKS AND COMPLICATIONS  Generally, this is a safe procedure. However, as with any procedure, complications can  occur. Possible complications include:  Perforation of the uterus.  Bleeding.  Infection of the uterus, bladder, or vagina.  Injury to surrounding organs.  An air bubble to the lung (air embolus).  Pregnancy following the procedure.  Failure of the procedure to help the problem, requiring hysterectomy.  Decreased ability to diagnose cancer in the lining of the uterus. BEFORE THE PROCEDURE  The lining of the uterus must be tested to make sure there is no  pre-cancerous or cancer cells present.  An ultrasound may be performed to look at the size of the uterus and to check for abnormalities.  Medicines may be given to thin the lining of the uterus. PROCEDURE  During the procedure, your health care provider will use a tool called a resectoscope to help see inside your uterus. There are different ways to remove the lining of your uterus.   Radiofrequency - This method uses a radiofrequency-alternating electric current to remove the lining of the uterus.  Cryotherapy - This method uses extreme cold to freeze the lining of the uterus.  Heated-Free Liquid - This method uses heated salt (saline) solution to remove the lining of the uterus.  Microwave - This method uses high-energy microwaves to heat up the lining of the uterus to remove it.  Thermal balloon - This method involves inserting a catheter with a balloon tip into the uterus. The balloon tip is filled with heated fluid to remove the lining of the uterus. AFTER THE PROCEDURE  After your procedure, do not have sexual intercourse or insert anything into your vagina until permitted by your health care provider. After the procedure, you may experience:  Cramps.  Vaginal discharge.  Frequent urination.   This information is not intended to replace advice given to you by your health care provider. Make sure you discuss any questions you have with your health care provider.   Document Released: 04/14/2004 Document Revised: 02/24/2015 Document Reviewed: 11/06/2012 Elsevier Interactive Patient Education [employer]. Hysteroscopy Hysteroscopy is a procedure used for looking inside the womb (uterus). It may be done for various reasons, including:  To evaluate abnormal bleeding, fibroid (benign, noncancerous) tumors, polyps, scar tissue (adhesions), and possibly cancer of the uterus.  To look for lumps (tumors) and other uterine growths.  To look for causes of why a woman cannot get  pregnant (infertility), causes of recurrent loss of pregnancy (miscarriages), or a lost intrauterine device (IUD).  To perform a sterilization by blocking the fallopian tubes from inside the uterus. In this procedure, a thin, flexible tube with a tiny light and camera on the end of it (hysteroscope) is used to look inside the uterus. A hysteroscopy should be done right after a menstrual period to be sure you are not pregnant. LET [facility] CARE PROVIDER KNOW ABOUT:   Any allergies you have.  All medicines you are taking, including vitamins, herbs, eye drops, creams, and over-the-counter medicines.  Previous problems you or members of your family have had with the use of anesthetics.  Any blood disorders you have.  Previous surgeries you have had.  Medical conditions you have. RISKS AND COMPLICATIONS  Generally, this is a safe procedure. However, as with any procedure, complications can occur. Possible complications include:  Putting a hole in the uterus.  Excessive bleeding.  Infection.  Damage to the cervix.  Injury to other organs.  Allergic reaction to medicines.  Too much fluid used in the uterus for the procedure. BEFORE THE PROCEDURE   Ask your health  care provider about changing or stopping any regular medicines.  Do not take aspirin or blood thinners for 1 week before the procedure, or as directed by your health care provider. These can cause bleeding.  If you smoke, do not smoke for 2 weeks before the procedure.  In some cases, a medicine is placed in the cervix the day before the procedure. This medicine makes the cervix have a larger opening (dilate). This makes it easier for the instrument to be inserted into the uterus during the procedure.  Do not eat or drink anything for at least 8 hours before the surgery.  Arrange for someone to take you home after the procedure. PROCEDURE   You may be given a medicine to relax you (sedative). You may also be given  one of the following:  A medicine that numbs the area around the cervix (local anesthetic).  A medicine that makes you sleep through the procedure (general anesthetic).  The hysteroscope is inserted through the vagina into the uterus. The camera on the hysteroscope sends a picture to a TV screen. This gives the surgeon a good view inside the uterus.  During the procedure, air or a liquid is put into the uterus, which allows the surgeon to see better.  Sometimes, tissue is gently scraped from inside the uterus. These tissue samples are sent to a lab for testing. AFTER THE PROCEDURE   If you had a general anesthetic, you may be groggy for a couple hours after the procedure.  If you had a local anesthetic, you will be able to go home as soon as you are stable and feel ready.  You may have some cramping. This normally lasts for a couple days.  You may have bleeding, which varies from light spotting for a few days to menstrual-like bleeding for 3-7 days. This is normal.  If your test results are not back during the visit, make an appointment with your health care provider to find out the results.   This information is not intended to replace advice given to you by your health care provider. Make sure you discuss any questions you have with your health care provider.   Document Released: 09/11/2000 Document Revised: 03/26/2013 Document Reviewed: 01/02/2013 Elsevier Interactive Patient Education 2016 [place]. Hysteroscopy, Care After Refer to this sheet in the next few weeks. These instructions provide you with information on caring for yourself after your procedure. Your health care provider may also give you more specific instructions. Your treatment has been planned according to current medical practices, but problems sometimes occur. Call your health care provider if you have any problems or questions after your procedure.  WHAT TO EXPECT AFTER THE PROCEDURE After your procedure, it  is typical to have the following:  You may have some cramping. This normally lasts for a couple days.  You may have bleeding. This can vary from light spotting for a few days to menstrual-like bleeding for 3-7 days. HOME CARE INSTRUCTIONS  Rest for the first 1-2 days after the procedure.  Only take over-the-counter or prescription medicines as directed by your health care provider. Do not take aspirin. It can increase the chances of bleeding.  Take showers instead of baths for 2 weeks or as directed by your health care provider.  Do not drive for 24 hours or as directed.  Do not drink alcohol while taking pain medicine.  Do not use tampons, douche, or have sexual intercourse for 2 weeks or until your health care provider  says it is okay.  Take your temperature twice a day for 4-5 days. Write it down each time.  Follow your health care provider's advice about diet, exercise, and lifting.  If you develop constipation, you may:  Take a mild laxative if your health care provider approves.  Add bran foods to your diet.  Drink enough fluids to keep your urine clear or pale yellow.  Try to have someone with you or available to you for the first 24-48 hours, especially if you were given a general anesthetic.  Follow up with your health care provider as directed. SEEK MEDICAL CARE IF:  You feel dizzy or lightheaded.  You feel sick to your stomach (nauseous).  You have abnormal vaginal discharge.  You have a rash.  You have pain that is not controlled with medicine. SEEK IMMEDIATE MEDICAL CARE IF:  You have bleeding that is heavier than a normal menstrual period.  You have a fever.  You have increasing cramps or pain, not controlled with medicine.  You have new belly (abdominal) pain.  You pass out.  You have pain in the tops of your shoulders (shoulder strap areas).  You have shortness of breath.   This information is not intended to replace advice given to you by  your health care provider. Make sure you discuss any questions you have with your health care provider.   Document Released: 03/26/2013 Document Reviewed: 03/26/2013 Elsevier Interactive Patient Education 2016 Elsevier Inc. Dilation and Curettage or Vacuum Curettage Dilation and curettage (D&C) and vacuum curettage are minor procedures. A D&C involves stretching (dilation) the cervix and scraping (curettage) the inside lining of the womb (uterus). During a D&C, tissue is gently scraped from the inside lining of the uterus. During a vacuum curettage, the lining and tissue in the uterus are removed with the use of gentle suction.  Curettage may be performed to either diagnose or treat a problem. As a diagnostic procedure, curettage is performed to examine tissues from the uterus. A diagnostic curettage may be performed for the following symptoms:   Irregular bleeding in the uterus.   Bleeding with the development of clots.   Spotting between menstrual periods.   Prolonged menstrual periods.   Bleeding after menopause.   No menstrual period (amenorrhea).   A change in size and shape of the uterus.  As a treatment procedure, curettage may be performed for the following reasons:   Removal of an IUD (intrauterine device).   Removal of retained placenta after giving birth. Retained placenta can cause an infection or bleeding severe enough to require transfusions.   Abortion.   Miscarriage.   Removal of polyps inside the uterus.   Removal of uncommon types of noncancerous lumps (fibroids).  LET [facility] CARE PROVIDER KNOW ABOUT:   Any allergies you have.   All medicines you are taking, including vitamins, herbs, eye drops, creams, and over-the-counter medicines.   Previous problems you or members of your family have had with the use of anesthetics.   Any blood disorders you have.   Previous surgeries you have had.   Medical conditions you have. RISKS AND  COMPLICATIONS  Generally, this is a safe procedure. However, as with any procedure, complications can occur. Possible complications include:  Excessive bleeding.   Infection of the uterus.   Damage to the cervix.   Development of scar tissue (adhesions) inside the uterus, later causing abnormal amounts of menstrual bleeding.   Complications from the general anesthetic, if a general anesthetic is  used.   Putting a hole (perforation) in the uterus. This is rare.  BEFORE THE PROCEDURE   Eat and drink before the procedure only as directed by your health care provider.   Arrange for someone to take you home.  PROCEDURE  This procedure usually takes about 15-30 minutes.  You will be given one of the following:  A medicine that numbs the area in and around the cervix (local anesthetic).   A medicine to make you sleep through the procedure (general anesthetic).  You will lie on your back with your legs in stirrups.   A warm metal or plastic instrument (speculum) will be placed in your vagina to keep it open and to allow the health care provider to see the cervix.  There are two ways in which your cervix can be softened and dilated. These include:   Taking a medicine.   Having thin rods (laminaria) inserted into your cervix.   A curved tool (curette) will be used to scrape cells from the inside lining of the uterus. In some cases, gentle suction is applied with the curette. The curette will then be removed.  AFTER THE PROCEDURE   You will rest in the recovery area until you are stable and are ready to go home.   You may feel sick to your stomach (nauseous) or throw up (vomit) if you were given a general anesthetic.   You may have a sore throat if a tube was placed in your throat during general anesthesia.   You may have light cramping and bleeding. This may last for 2 days to 2 weeks after the procedure.   Your uterus needs to make a new lining after the  procedure. This may make your next period late.   This information is not intended to replace advice given to you by your health care provider. Make sure you discuss any questions you have with your health care provider.   Document Released: 06/05/2005 Document Revised: 02/05/2013 Document Reviewed: 01/02/2013 Elsevier Interactive Patient Education 2016 Elsevier Inc. Dilation and Curettage or Vacuum Curettage, Care After These instructions give you information on caring for yourself after your procedure. Your doctor may also give you more specific instructions. Call your doctor if you have any problems or questions after your procedure. HOME CARE  Do not drive for 24 hours.  Wait 1 week before doing any activities that wear you out.  Take your temperature 2 times a day for 4 days. Write it down. Tell your doctor if you have a fever.  Do not stand for a long time.  Do not lift, push, or pull anything over 10 pounds (4.5 kilograms).  Limit stair climbing to once or twice a day.  Rest often.  Continue with your usual diet.  Drink enough fluids to keep your pee (urine) clear or pale yellow.  If you have a hard time pooping (constipation), you may:  Take a medicine to help you go poop (laxative) as told by your doctor.  Eat more fruit and bran.  Drink more fluids.  Take showers, not baths, for as long as told by your doctor.  Do not swim or use a hot tub until your doctor says it is okay.  Have someone with you for 1-2 days after the procedure.  Do not douche, use tampons, or have sex (intercourse) for 2 weeks.  Only take medicines as told by your doctor. Do not take aspirin. It can cause bleeding.  Keep all doctor visits. GET  HELP IF:  You have cramps or pain not helped by medicine.  You have new pain in the belly (abdomen).  You have a bad smelling fluid coming from your vagina.  You have a rash.  You have problems with any medicine. GET HELP RIGHT AWAY IF:    You start to bleed more than a regular period.  You have a fever.  You have chest pain.  You have trouble breathing.  You feel dizzy or feel like passing out (fainting).  You pass out.  You have pain in the tops of your shoulders.  You have vaginal bleeding with or without clumps of blood (blood clots). MAKE SURE YOU:  Understand these instructions.  Will watch your condition.  Will get help right away if you are not doing well or get worse.   This information is not intended to replace advice given to you by your health care provider. Make sure you discuss any questions you have with your health care provider.   Document Released: 03/14/2008 Document Revised: 06/10/2013 Document Reviewed: 01/02/2013 Elsevier Interactive Patient Education 2016 [employer]. PATIENT INSTRUCTIONS POST-ANESTHESIA  IMMEDIATELY FOLLOWING SURGERY:  Do not drive or operate machinery for the first twenty four hours after surgery.  Do not make any important decisions for twenty four hours after surgery or while taking narcotic pain medications or sedatives.  If you develop intractable nausea and vomiting or a severe headache please notify your doctor immediately.  FOLLOW-UP:  Please make an appointment with your surgeon as instructed. You do not need to follow up with anesthesia unless specifically instructed to do so.  WOUND CARE INSTRUCTIONS (if applicable):  Keep a dry clean dressing on the anesthesia/puncture wound site if there is drainage.  Once the wound has quit draining you may leave it open to air.  Generally you should leave the bandage intact for twenty four hours unless there is drainage.  If the epidural site drains for more than 36-48 hours please call the anesthesia department.  QUESTIONS?:  Please feel free to call your physician or the hospital operator if you have any questions, and they will be happy to assist you.

## 2015-12-16 ENCOUNTER — Encounter (HOSPITAL_COMMUNITY)
Admission: RE | Admit: 2015-12-16 | Discharge: 2015-12-16 | Disposition: A | Discharge status: Home / Self Care | Payer: BLUE CROSS/BLUE SHIELD | Source: Ambulatory Visit | Attending: Obstetrics & Gynecology | Admitting: Obstetrics & Gynecology

## 2015-12-16 ENCOUNTER — Encounter: Payer: BLUE CROSS/BLUE SHIELD | Admitting: Obstetrics & Gynecology

## 2015-12-22 ENCOUNTER — Ambulatory Visit (HOSPITAL_COMMUNITY)
Admission: RE | Admit: 2015-12-22 | Payer: BLUE CROSS/BLUE SHIELD | Source: Ambulatory Visit | Admitting: Obstetrics & Gynecology

## 2015-12-22 ENCOUNTER — Encounter (HOSPITAL_COMMUNITY): Admission: RE | Payer: Self-pay | Source: Ambulatory Visit

## 2015-12-22 SURGERY — DILATATION & CURETTAGE/HYSTEROSCOPY WITH NOVASURE ABLATION
Anesthesia: Choice

## 2015-12-23 ENCOUNTER — Ambulatory Visit: Payer: BLUE CROSS/BLUE SHIELD | Admitting: Obstetrics & Gynecology

## 2015-12-23 ENCOUNTER — Other Ambulatory Visit: Payer: Self-pay | Admitting: Obstetrics & Gynecology

## 2015-12-30 ENCOUNTER — Ambulatory Visit (INDEPENDENT_AMBULATORY_CARE_PROVIDER_SITE_OTHER): Payer: BLUE CROSS/BLUE SHIELD | Admitting: Obstetrics & Gynecology

## 2015-12-30 ENCOUNTER — Encounter: Payer: Self-pay | Admitting: Obstetrics & Gynecology

## 2015-12-30 VITALS — BP 130/70 | HR 76 | Wt 315.0 lb

## 2015-12-30 DIAGNOSIS — Z6841 Body Mass Index (BMI) 40.0 and over, adult: Secondary | ICD-10-CM

## 2015-12-30 DIAGNOSIS — N920 Excessive and frequent menstruation with regular cycle: Secondary | ICD-10-CM

## 2015-12-30 DIAGNOSIS — E669 Obesity, unspecified: Secondary | ICD-10-CM

## 2015-12-30 DIAGNOSIS — D500 Iron deficiency anemia secondary to blood loss (chronic): Secondary | ICD-10-CM | POA: Diagnosis not present

## 2015-12-30 MED ORDER — METFORMIN HCL 500 MG PO TABS: ORAL_TABLET | ORAL | Status: DC

## 2015-12-30 MED ORDER — MEDROXYPROGESTERONE ACETATE 150 MG/ML IM SUSP: 150.0000 mg | Freq: Once | INTRAMUSCULAR | Status: DC

## 2015-12-30 MED ORDER — ALPRAZOLAM 0.5 MG PO TABS: 0.5000 mg | ORAL_TABLET | Freq: Three times a day (TID) | ORAL | Status: DC | PRN

## 2015-12-30 NOTE — Progress Notes (Signed)
Patient ID: Krista [name], female   DOB: 1975-10-19, 40 y.o.   MRN: [id number]      Chief Complaint  Patient presents with  . Follow-up    Blood pressure 130/70, pulse 76, weight (!) 315 lb (142.9 kg).  40 y.o. [id number] No LMP recorded. Patient is not currently having periods (Reason: Other). The current method of family planning is Depo-Provera injections.  Subjective Pt decided against ablation at this time, will try to manage with heavy handed depo provera, pt agrees to give it a shot literally  Objective   Pertinent ROS Per HPI no new issues  Labs or studies     Impression Diagnoses this Encounter::   ICD-9-CM ICD-10-CM   1. Menorrhagia with regular cycle 626.2 N92.0   2. Anemia due to chronic blood loss 280.0 D50.0     Established relevant diagnosis(es):   Plan/Recommendations: Meds ordered this encounter  Medications  . medroxyPROGESTERone (DEPO-PROVERA) 150 MG/ML injection    Sig: Inject 1 mL (150 mg total) into the muscle once.    Dispense:  1 mL    Refill:  3    Please give 2 vials treating menometrorrhagia  . ALPRAZolam (XANAX) 0.5 MG tablet    Sig: Take 1 tablet (0.5 mg total) by mouth 3 (three) times daily as needed for anxiety.    Dispense:  60 tablet    Refill:  1  . metFORMIN (GLUCOPHAGE) 500 MG tablet    Sig: TAKE HALF TAB BY MOUTH TWICE A DAY    Dispense:  30 tablet    Refill:  5    Labs or Scans Ordered: No orders of the defined types were placed in this encounter.   Management:: 300 mg depo provera Will decide how to proceed based on her response  Follow up Return if symptoms worsen or fail to improve.        Face to face time:  15 minutes  Greater than 50% of the visit time was spent in counseling and coordination of care with the patient.  The summary and outline of the counseling and care coordination is summarized in the note above.   All questions were answered.

## 2015-12-31 ENCOUNTER — Encounter: Payer: BLUE CROSS/BLUE SHIELD | Admitting: Obstetrics & Gynecology

## 2016-01-12 ENCOUNTER — Other Ambulatory Visit: Payer: Self-pay | Admitting: Adult Health

## 2016-01-28 ENCOUNTER — Ambulatory Visit: Payer: BLUE CROSS/BLUE SHIELD | Admitting: Obstetrics & Gynecology

## 2016-02-28 ENCOUNTER — Telehealth: Payer: Self-pay | Admitting: Adult Health

## 2016-02-28 MED ORDER — BUPROPION HCL ER (SR) 200 MG PO TB12: 200.0000 mg | ORAL_TABLET | Freq: Two times a day (BID) | ORAL | 3 refills | Status: DC

## 2016-02-28 NOTE — Telephone Encounter (Signed)
Left message x 1. JSY 

## 2016-02-28 NOTE — Telephone Encounter (Signed)
Spoke with pt. Pt wants Wellbutrin increased. She is on 150 mg BID. Pt is crying a lot and feel anxious. "Feels like the weight of the world is on her shoulders". Please advise. Thanks!! [place]

## 2016-02-28 NOTE — Telephone Encounter (Signed)
Left message that I called wellbutrin 200 mg 1 bid in and if not better, will need appt

## 2016-03-11 ENCOUNTER — Other Ambulatory Visit: Payer: Self-pay | Admitting: Adult Health

## 2016-03-14 ENCOUNTER — Other Ambulatory Visit: Payer: Self-pay | Admitting: Obstetrics & Gynecology

## 2016-04-11 ENCOUNTER — Ambulatory Visit (INDEPENDENT_AMBULATORY_CARE_PROVIDER_SITE_OTHER): Payer: BLUE CROSS/BLUE SHIELD | Admitting: Obstetrics & Gynecology

## 2016-04-11 ENCOUNTER — Encounter: Payer: Self-pay | Admitting: Obstetrics & Gynecology

## 2016-04-11 VITALS — BP 184/80 | HR 92 | Ht 69.5 in | Wt 321.4 lb

## 2016-04-11 DIAGNOSIS — E6609 Other obesity due to excess calories: Secondary | ICD-10-CM | POA: Diagnosis not present

## 2016-04-11 DIAGNOSIS — Z6841 Body Mass Index (BMI) 40.0 and over, adult: Secondary | ICD-10-CM | POA: Diagnosis not present

## 2016-04-11 DIAGNOSIS — D500 Iron deficiency anemia secondary to blood loss (chronic): Secondary | ICD-10-CM

## 2016-04-11 DIAGNOSIS — E119 Type 2 diabetes mellitus without complications: Secondary | ICD-10-CM | POA: Diagnosis not present

## 2016-04-11 DIAGNOSIS — N921 Excessive and frequent menstruation with irregular cycle: Secondary | ICD-10-CM

## 2016-04-11 MED ORDER — METFORMIN HCL 500 MG PO TABS: ORAL_TABLET | ORAL | 5 refills | Status: DC

## 2016-04-11 MED ORDER — BUSPIRONE HCL 10 MG PO TABS: 10.0000 mg | ORAL_TABLET | Freq: Three times a day (TID) | ORAL | 11 refills | Status: DC

## 2016-04-11 NOTE — Progress Notes (Signed)
Chief Complaint  Patient presents with  . discuss medicine    A1C blood work    Blood pressure (!) 184/80, pulse 92, height 5' 9.5" (1.765 m), weight (!) 321 lb 6.4 oz (145.8 kg), last menstrual period 10/18/2015.  40 y.o. [id number] Patient's last menstrual period was 10/18/2015. The current method of family planning is oral progesterone-only contraceptive + depo provera  Outpatient Encounter Prescriptions as of 04/11/2016  Medication Sig  . ALPRAZolam (XANAX) 0.5 MG tablet TAKE 1 TABLET BY MOUTH 3 TIMES DAILY AS NEEDED FOR ANXIETY  . Ascorbic Acid (VITAMIN C) 1000 MG tablet Take 1,000 mg by mouth daily. Reported on 12/30/2015  . buPROPion (WELLBUTRIN SR) 200 MG 12 hr tablet Take 1 tablet (200 mg total) by mouth 2 (two) times daily.  . fexofenadine-pseudoephedrine (ALLEGRA-D 24) 180-240 MG per 24 hr tablet Take 1 tablet by mouth as needed.   Marland Kitchen ketorolac (TORADOL) 10 MG tablet TAKE (1) TABLET BY MOUTH EVERY EIGHT HOURS AS NEEDED.  Marland Kitchen lisinopril (PRINIVIL,ZESTRIL) 10 MG tablet Take 1 tablet (10 mg total) by mouth daily.  . medroxyPROGESTERone (DEPO-PROVERA) 150 MG/ML injection Inject 1 mL (150 mg total) into the muscle once.  . Melatonin 5 MG TABS Take by mouth as needed.   . metFORMIN (GLUCOPHAGE) 500 MG tablet TAKE 1 tablet BY MOUTH TWICE A DAY  . Multiple Vitamin (MULTIVITAMIN) tablet Take 1 tablet by mouth daily.  . naproxen sodium (ANAPROX) 550 MG tablet TAKE ONE TABLET BY MOUTH EVERY 8-12 HOURS AS NEEDED CRAMPS  . UNABLE TO FIND 2 (two) times daily. Claritin nasal spray  . [DISCONTINUED] megestrol (MEGACE) 40 MG tablet TAKE 1 TABLET (40 MG TOTAL) BY MOUTH DAILY.  . [DISCONTINUED] metFORMIN (GLUCOPHAGE) 500 MG tablet TAKE HALF TAB BY MOUTH TWICE A DAY  . busPIRone (BUSPAR) 10 MG tablet Take 1 tablet (10 mg total) by mouth 3 (three) times daily.  . [DISCONTINUED] ketoconazole (NIZORAL) 2 % cream Apply 1 application topically 2 (two) times daily. (Patient not taking: Reported on  04/11/2016)   No facility-administered encounter medications on file as of 04/11/2016.     Subjective Pt is doing well on the megestrol + depo provera together, her bleeding is controlled with minimal spotting For now she wants to continue on that regimen  Additionally she wants to have her A1C checked  Objective   Pertinent ROS No burning with urination, frequency or urgency No nausea, vomiting or diarrhea Nor fever chills or other constitutional symptoms   Labs or studies pending    Impression Diagnoses this Encounter::   ICD-9-CM ICD-10-CM   1. Menometrorrhagia 626.2 N92.1   2. Type II diabetes mellitus, well controlled ([employer]) 250.00 E11.9 HgB A1c  3. Anemia due to chronic blood loss 280.0 D50.0     Established relevant diagnosis(es):   Plan/Recommendations: Meds ordered this encounter  Medications  . busPIRone (BUSPAR) 10 MG tablet    Sig: Take 1 tablet (10 mg total) by mouth 3 (three) times daily.    Dispense:  90 tablet    Refill:  11  . metFORMIN (GLUCOPHAGE) 500 MG tablet    Sig: TAKE 1 tablet BY MOUTH TWICE A DAY    Dispense:  60 tablet    Refill:  5    Labs or Scans Ordered: Orders Placed This Encounter  Procedures  . HgB A1c    Management:: Continue megestrol + depo provera  Follow up Return if symptoms worsen or fail to improve.  Face to face time:  1 minutes  Greater than 50% of the visit time was spent in counseling and coordination of care with the patient.  The summary and outline of the counseling and care coordination is summarized in the note above.   All questions were answered.  Past Medical History:  Diagnosis Date  . Allergy   . Anxiety   . Asthma    childhood  . Depression   . Diabetes mellitus without complication ([place])   . Fibroids 02/13/2014  . Friable cervix 03/15/2015  . Hypertension   . Hypothyroid   . Irregular bleeding 03/15/2015  . Lyme disease   . Menometrorrhagia 02/11/2014  . Obesity   .  Rosacea   . Severe menstrual cramps 09/17/2015  . Superficial fungus infection of skin 10/20/2015  . Weight gain 08/31/2015    Past Surgical History:  Procedure Laterality Date  . CESAREAN SECTION    . CHOLECYSTECTOMY    . ingrown toe nail removal     . ovarian cyst removal     . TONSILLECTOMY    . WISDOM TOOTH EXTRACTION      OB History    Gravida Para Term Preterm AB Living   5 3     2 3    SAB TAB Ectopic Multiple Live Births   2       3      No Known Allergies  Social History   Social History  . Marital status: Married    Spouse name: N/A  . Number of children: 3  . Years of education: Assoc   Occupational History  . [employer]    Social History Main Topics  . Smoking status: Never Smoker  . Smokeless tobacco: Never Used  . Alcohol use 0.0 oz/week     Comment: rarely  . Drug use: No  . Sexual activity: Yes    Birth control/ protection: None, Condom   Other Topics Concern  . None   Social History Narrative   Drinks one soda a day     Family History  Problem Relation Age of Onset  . Diabetes Mother   . Hypertension Mother   . Heart attack Father   . Hypertension Father   . Depression Sister   . Diabetes Maternal Grandmother   . Coronary artery disease Maternal Grandmother   . Diabetes Maternal Grandfather   . Coronary artery disease Maternal Grandfather   . Coronary artery disease Paternal Grandmother   . Diabetes Paternal Grandmother   . Coronary artery disease Paternal Grandfather

## 2016-04-12 LAB — HEMOGLOBIN A1C
ESTIMATED AVERAGE GLUCOSE: 131 mg/dL
HEMOGLOBIN A1C: 6.2 % — AB (ref 4.8–5.6)

## 2016-04-21 ENCOUNTER — Other Ambulatory Visit: Payer: Self-pay | Admitting: Adult Health

## 2016-05-06 ENCOUNTER — Other Ambulatory Visit: Payer: Self-pay | Admitting: Adult Health

## 2016-05-14 ENCOUNTER — Other Ambulatory Visit: Payer: Self-pay | Admitting: Adult Health

## 2016-05-30 ENCOUNTER — Other Ambulatory Visit: Payer: Self-pay | Admitting: Obstetrics & Gynecology

## 2016-06-19 ENCOUNTER — Other Ambulatory Visit: Payer: Self-pay | Admitting: Obstetrics & Gynecology

## 2016-07-02 ENCOUNTER — Other Ambulatory Visit: Payer: Self-pay | Admitting: Obstetrics & Gynecology

## 2016-08-24 ENCOUNTER — Other Ambulatory Visit: Payer: Self-pay | Admitting: Obstetrics & Gynecology

## 2016-09-11 ENCOUNTER — Other Ambulatory Visit: Payer: Self-pay | Admitting: Obstetrics & Gynecology

## 2016-09-26 ENCOUNTER — Other Ambulatory Visit: Payer: Self-pay | Admitting: *Deleted

## 2016-09-26 MED ORDER — BUSPIRONE HCL 10 MG PO TABS: 10.0000 mg | ORAL_TABLET | Freq: Three times a day (TID) | ORAL | 3 refills | Status: DC

## 2016-09-27 ENCOUNTER — Other Ambulatory Visit: Payer: Self-pay | Admitting: *Deleted

## 2016-09-28 ENCOUNTER — Telehealth: Payer: Self-pay | Admitting: *Deleted

## 2016-09-28 NOTE — Telephone Encounter (Signed)
Medication requested was sent to [facility] instead of [facility]. Spoke with [facility] and verbal order for prescribed medication given.

## 2016-10-18 ENCOUNTER — Other Ambulatory Visit: Payer: Self-pay | Admitting: Adult Health

## 2016-10-18 ENCOUNTER — Telehealth: Payer: Self-pay | Admitting: *Deleted

## 2016-10-18 NOTE — Telephone Encounter (Signed)
Informed patient that prescription has been refilled. Pt verbalized understanding.

## 2016-11-04 ENCOUNTER — Other Ambulatory Visit: Payer: Self-pay | Admitting: Obstetrics & Gynecology

## 2016-11-10 ENCOUNTER — Other Ambulatory Visit: Payer: Self-pay | Admitting: Obstetrics & Gynecology

## 2016-11-21 ENCOUNTER — Encounter: Payer: Self-pay | Admitting: Adult Health

## 2016-11-21 ENCOUNTER — Ambulatory Visit (INDEPENDENT_AMBULATORY_CARE_PROVIDER_SITE_OTHER): Payer: BLUE CROSS/BLUE SHIELD | Admitting: Adult Health

## 2016-11-21 VITALS — BP 150/80 | HR 78 | Ht 69.5 in | Wt 331.5 lb

## 2016-11-21 DIAGNOSIS — Z131 Encounter for screening for diabetes mellitus: Secondary | ICD-10-CM

## 2016-11-21 DIAGNOSIS — R319 Hematuria, unspecified: Secondary | ICD-10-CM | POA: Insufficient documentation

## 2016-11-21 DIAGNOSIS — F32A Depression, unspecified: Secondary | ICD-10-CM

## 2016-11-21 DIAGNOSIS — I10 Essential (primary) hypertension: Secondary | ICD-10-CM | POA: Diagnosis not present

## 2016-11-21 DIAGNOSIS — R309 Painful micturition, unspecified: Secondary | ICD-10-CM | POA: Diagnosis not present

## 2016-11-21 DIAGNOSIS — R635 Abnormal weight gain: Secondary | ICD-10-CM | POA: Diagnosis not present

## 2016-11-21 DIAGNOSIS — R5383 Other fatigue: Secondary | ICD-10-CM

## 2016-11-21 DIAGNOSIS — F329 Major depressive disorder, single episode, unspecified: Secondary | ICD-10-CM | POA: Diagnosis not present

## 2016-11-21 LAB — POCT URINALYSIS DIPSTICK
Glucose, UA: NEGATIVE
Ketones, UA: NEGATIVE
LEUKOCYTES UA: NEGATIVE
NITRITE UA: NEGATIVE
Protein, UA: NEGATIVE

## 2016-11-21 MED ORDER — SULFAMETHOXAZOLE-TRIMETHOPRIM 800-160 MG PO TABS: 1.0000 | ORAL_TABLET | Freq: Two times a day (BID) | ORAL | 0 refills | Status: DC

## 2016-11-21 MED ORDER — PHENAZOPYRIDINE HCL 200 MG PO TABS: 200.0000 mg | ORAL_TABLET | Freq: Three times a day (TID) | ORAL | 0 refills | Status: DC | PRN

## 2016-11-21 MED ORDER — HYDROCHLOROTHIAZIDE 25 MG PO TABS: 25.0000 mg | ORAL_TABLET | Freq: Every day | ORAL | 6 refills | Status: DC

## 2016-11-21 NOTE — Progress Notes (Signed)
Subjective:     Patient ID: Krista [name], female   DOB: 1976/03/30, 41 y.o.   MRN: [id number]  HPI Krista [name] is a 41 year old white female, married in complaining of pain with urination and weight gain and does not feel good about self, needs to do something about weight. She says she is a stress eater. She is going to school 2 nights a week too, and has stopped Wellbutrin and Buspar.   Review of Systems Pain with urination  Weight gain Does not feel good about self Reviewed past medical,surgical, social and family history. Reviewed medications and allergies.     Objective:   Physical Exam BP (!) 150/80 (BP Location: Left Arm, Patient Position: Sitting, Cuff Size: Large)   Pulse 78   Ht 5' 9.5" (1.765 m)   Wt (!) 331 lb 8 oz (150.4 kg)   BMI 48.25 kg/m urine trace blood,Skin warm and dry. Lungs: clear to ausculation bilaterally. Cardiovascular: regular rate and rhythm. Bladder non tender, no CVAT,   PHQ 9 score 8, denies being suicidal and declines meds for now, encouraged to put self first some, and make better food choices(less bread and fried foods) and increase water and less sweet tea, google Contrave, lets check labs.She is teary, youngest just graduated kindergarten and oldest graduated 5th grade.Sons friend told him "boy you have fat momma". Also mentioned sleeve surgery. Will fluid pill to lisinopril. Face time 15 minutes with 50% counseling.   Assessment:     1. Pain with urination   2. Hematuria, unspecified type   3. Depression, unspecified depression type   4. Weight gain   5. Essential hypertension   6. Fatigue, unspecified type   7. Screening for diabetes mellitus       Plan:     Check CBC,CMP,TSH and A1c Meds ordered this encounter  Medications  . sulfamethoxazole-trimethoprim (BACTRIM DS,SEPTRA DS) 800-160 MG tablet    Sig: Take 1 tablet by mouth 2 (two) times daily.    Dispense:  14 tablet    Refill:  0    Order Specific Question:   Supervising  Provider    Answer:   Elonda Husky, LUTHER H [[id number]]  . phenazopyridine (PYRIDIUM) 200 MG tablet    Sig: Take 1 tablet (200 mg total) by mouth 3 (three) times daily as needed for pain.    Dispense:  10 tablet    Refill:  0    Order Specific Question:   Supervising Provider    Answer:   Elonda Husky, LUTHER H [[id number]]  . hydrochlorothiazide (HYDRODIURIL) 25 MG tablet    Sig: Take 1 tablet (25 mg total) by mouth daily.    Dispense:  30 tablet    Refill:  6    Order Specific Question:   Supervising Provider    Answer:   Tania Ade H [[id number]]  UA C&S sent Follow up in 3 weeks

## 2016-11-22 LAB — URINALYSIS, ROUTINE W REFLEX MICROSCOPIC
BILIRUBIN UA: NEGATIVE
Glucose, UA: NEGATIVE
Ketones, UA: NEGATIVE
Nitrite, UA: POSITIVE — AB
PH UA: 5.5 (ref 5.0–7.5)
RBC UA: NEGATIVE
Specific Gravity, UA: 1.024 (ref 1.005–1.030)
Urobilinogen, Ur: 1 mg/dL (ref 0.2–1.0)

## 2016-11-22 LAB — MICROSCOPIC EXAMINATION: CASTS: NONE SEEN /LPF

## 2016-11-23 ENCOUNTER — Telehealth: Payer: Self-pay | Admitting: Adult Health

## 2016-11-23 LAB — URINE CULTURE

## 2016-11-23 NOTE — Telephone Encounter (Signed)
Left message that urine culture did not grow anything dominant

## 2016-12-05 ENCOUNTER — Other Ambulatory Visit: Payer: Self-pay | Admitting: Obstetrics & Gynecology

## 2016-12-11 ENCOUNTER — Telehealth: Payer: Self-pay | Admitting: *Deleted

## 2016-12-11 NOTE — Telephone Encounter (Signed)
LMOVM that orders in are available to any [facility]. She does not need papers to have drawn but if she has any problem to give [place] a call

## 2016-12-13 ENCOUNTER — Ambulatory Visit: Payer: BLUE CROSS/BLUE SHIELD | Admitting: Adult Health

## 2016-12-25 ENCOUNTER — Ambulatory Visit: Payer: BLUE CROSS/BLUE SHIELD | Admitting: Adult Health

## 2016-12-30 ENCOUNTER — Other Ambulatory Visit: Payer: Self-pay | Admitting: Obstetrics & Gynecology

## 2017-01-03 ENCOUNTER — Other Ambulatory Visit: Payer: Self-pay | Admitting: *Deleted

## 2017-01-03 MED ORDER — MEGESTROL ACETATE 40 MG PO TABS: ORAL_TABLET | ORAL | 3 refills | Status: DC

## 2017-01-24 ENCOUNTER — Other Ambulatory Visit: Payer: Self-pay | Admitting: Obstetrics & Gynecology

## 2017-01-24 ENCOUNTER — Other Ambulatory Visit: Payer: Self-pay | Admitting: Adult Health

## 2017-04-09 ENCOUNTER — Other Ambulatory Visit: Payer: Self-pay | Admitting: Obstetrics & Gynecology

## 2017-05-14 ENCOUNTER — Other Ambulatory Visit: Payer: Self-pay | Admitting: *Deleted

## 2017-05-14 ENCOUNTER — Other Ambulatory Visit: Payer: Self-pay | Admitting: Adult Health

## 2017-05-14 MED ORDER — HYDROCHLOROTHIAZIDE 25 MG PO TABS: 25.0000 mg | ORAL_TABLET | Freq: Every day | ORAL | 2 refills | Status: DC

## 2017-05-14 MED ORDER — METFORMIN HCL 500 MG PO TABS: 500.0000 mg | ORAL_TABLET | Freq: Two times a day (BID) | ORAL | 3 refills | Status: AC

## 2017-05-14 NOTE — Progress Notes (Signed)
Refill HCTZ

## 2017-05-18 ENCOUNTER — Telehealth: Payer: Self-pay | Admitting: *Deleted

## 2017-05-18 NOTE — Telephone Encounter (Signed)
LMOVM to return call.

## 2017-05-18 NOTE — Telephone Encounter (Signed)
Patient states she has been bleeding heavily despite taking Megace daily for 2 weeks. She was very sick and was 2 weeks late getting Depo injection. She states last time this happened she was prescribed an extra depo injection but also doesn't know if she needs to be evaluated again. Please advise.

## 2017-05-20 ENCOUNTER — Other Ambulatory Visit: Payer: Self-pay | Admitting: Obstetrics & Gynecology

## 2017-05-20 MED ORDER — MEDROXYPROGESTERONE ACETATE 150 MG/ML IM SUSY: 1.0000 mL | PREFILLED_SYRINGE | INTRAMUSCULAR | 3 refills | Status: DC

## 2017-05-20 NOTE — Telephone Encounter (Signed)
No she needs the extra depo injection, she has a pretty sensitive endometrial status  So tell her to get the extra one and I will e prescribe it to make sure

## 2017-05-21 NOTE — Telephone Encounter (Signed)
Informed patient that Dr Elonda Husky stated she needed an extra dose of Depo because of her sensitive endometrial status. Pt verbalized understanding.

## 2017-08-06 ENCOUNTER — Other Ambulatory Visit: Payer: Self-pay | Admitting: *Deleted

## 2017-08-06 MED ORDER — LISINOPRIL 10 MG PO TABS: 10.0000 mg | ORAL_TABLET | Freq: Every day | ORAL | 3 refills | Status: AC

## 2017-08-17 ENCOUNTER — Other Ambulatory Visit: Payer: Self-pay

## 2017-08-17 ENCOUNTER — Emergency Department (HOSPITAL_COMMUNITY): Payer: BLUE CROSS/BLUE SHIELD

## 2017-08-17 ENCOUNTER — Emergency Department (HOSPITAL_COMMUNITY)
Admission: EM | Admit: 2017-08-17 | Discharge: 2017-08-17 | Disposition: A | Discharge status: Home / Self Care | Payer: BLUE CROSS/BLUE SHIELD | Attending: Emergency Medicine | Admitting: Emergency Medicine

## 2017-08-17 ENCOUNTER — Encounter (HOSPITAL_COMMUNITY): Payer: Self-pay

## 2017-08-17 DIAGNOSIS — E119 Type 2 diabetes mellitus without complications: Secondary | ICD-10-CM | POA: Diagnosis not present

## 2017-08-17 DIAGNOSIS — I10 Essential (primary) hypertension: Secondary | ICD-10-CM | POA: Diagnosis not present

## 2017-08-17 DIAGNOSIS — R5383 Other fatigue: Secondary | ICD-10-CM

## 2017-08-17 DIAGNOSIS — Z79899 Other long term (current) drug therapy: Secondary | ICD-10-CM | POA: Diagnosis not present

## 2017-08-17 DIAGNOSIS — R000 Tachycardia, unspecified: Secondary | ICD-10-CM | POA: Insufficient documentation

## 2017-08-17 DIAGNOSIS — R079 Chest pain, unspecified: Secondary | ICD-10-CM | POA: Diagnosis present

## 2017-08-17 DIAGNOSIS — Z7984 Long term (current) use of oral hypoglycemic drugs: Secondary | ICD-10-CM | POA: Diagnosis not present

## 2017-08-17 DIAGNOSIS — R0602 Shortness of breath: Secondary | ICD-10-CM | POA: Diagnosis not present

## 2017-08-17 LAB — BASIC METABOLIC PANEL
Anion gap: 12 (ref 5–15)
BUN: 22 mg/dL — ABNORMAL HIGH (ref 6–20)
CHLORIDE: 105 mmol/L (ref 101–111)
CO2: 18 mmol/L — AB (ref 22–32)
Calcium: 9.4 mg/dL (ref 8.9–10.3)
Creatinine, Ser: 1.05 mg/dL — ABNORMAL HIGH (ref 0.44–1.00)
GFR calc non Af Amer: >60 mL/min (ref >60)
Glucose, Bld: 186 mg/dL — ABNORMAL HIGH (ref 65–99)
POTASSIUM: 3.5 mmol/L (ref 3.5–5.1)
SODIUM: 135 mmol/L (ref 135–145)

## 2017-08-17 LAB — CBC
HEMATOCRIT: 43.3 % (ref 36.0–46.0)
HEMOGLOBIN: 14.1 g/dL (ref 12.0–15.0)
MCH: 26.8 pg (ref 26.0–34.0)
MCHC: 32.6 g/dL (ref 30.0–36.0)
MCV: 82.3 fL (ref 78.0–100.0)
Platelets: 327 10*3/uL (ref 150–400)
RBC: 5.26 MIL/uL — AB (ref 3.87–5.11)
RDW: 14.6 % (ref 11.5–15.5)
WBC: 12.5 10*3/uL — AB (ref 4.0–10.5)

## 2017-08-17 LAB — D-DIMER, QUANTITATIVE (NOT AT ARMC): D DIMER QUANT: 0.28 ug{FEU}/mL (ref 0.00–0.50)

## 2017-08-17 LAB — I-STAT TROPONIN, ED
TROPONIN I, POC: 0 ng/mL (ref 0.00–0.08)
Troponin i, poc: 0.01 ng/mL (ref 0.00–0.08)

## 2017-08-17 LAB — I-STAT BETA HCG BLOOD, ED (MC, WL, AP ONLY)

## 2017-08-17 NOTE — Discharge Instructions (Signed)
Your evaluated in the emergency department for shortness of breath fatigue and rapid heart rate.  We did blood work EKG chest x-ray testing and did not find an obvious cause of your symptoms.  You will need to follow-up with your primary care doctor and return if any worsening symptoms.

## 2017-08-17 NOTE — ED Triage Notes (Signed)
Per Pt, Pt is coming from PCP with complaints of chest pressure along with sinus infection a couple weeks ago. Pt was reported to have changes on her EKG. Some SOB noted along with fatigue, N/D, no vomiting.

## 2017-08-17 NOTE — ED Provider Notes (Signed)
[place] EMERGENCY DEPARTMENT Provider Note   CSN: [id number] Arrival date & time: 08/17/17  1308     History   Chief Complaint Chief Complaint  Patient presents with  . Chest Pain    HPI Krista [name] is a 42 y.o. female.  She has a history of diabetes.  She states she had pneumonia last fall that gave her similar symptoms that she is coming today.  2 weeks ago she had a sinus infection.  For the last week she has had fatigue shortness of breath dyspnea on exertion and mild chest pain..  There is been no fever.  She went to her primary care doctor today and they got an EKG and told her she needed to come here because it was abnormal and she needed heart enzymes.  Her only risk factor for PE would be hormonal although she is only on a progesterone medication.  No recent plane flights no surgery no mobilization non-smoker.  She got no history of coronary disease but does have a strong family history.  The history is provided by the patient.  Chest Pain   This is a new problem. The current episode started more than 2 days ago. The problem occurs constantly. The problem has not changed since onset.The pain is present in the substernal region. The pain is mild. The quality of the pain is described as pressure-like. The pain does not radiate. Associated symptoms include exertional chest pressure, leg pain, nausea and shortness of breath. Pertinent negatives include no abdominal pain, no back pain, no cough, no fever, no hemoptysis, no palpitations and no vomiting. She has tried nothing for the symptoms. Risk factors include obesity.  Her past medical history is significant for anxiety/panic attacks and diabetes.  Pertinent negatives for past medical history include no seizures.    Past Medical History:  Diagnosis Date  . Allergy   . Anxiety   . Asthma    childhood  . Depression   . Diabetes mellitus without complication ([place])   . Fibroids 02/13/2014  . Friable  cervix 03/15/2015  . Hypertension   . Hypothyroid   . Irregular bleeding 03/15/2015  . Lyme disease   . Menometrorrhagia 02/11/2014  . Obesity   . Rosacea   . Severe menstrual cramps 09/17/2015  . Superficial fungus infection of skin 10/20/2015  . Weight gain 08/31/2015    Patient Active Problem List   Diagnosis Date Noted  . Hematuria 11/21/2016  . Pain with urination 11/21/2016  . Superficial fungus infection of skin 10/20/2015  . Anxiety 09/17/2015  . Severe menstrual cramps 09/17/2015  . Weight gain 08/31/2015  . Irregular bleeding 03/15/2015  . Friable cervix 03/15/2015  . Hypothyroid 03/15/2015  . Fibroids 02/13/2014  . Menometrorrhagia 02/11/2014  . Candidiasis of vulva and vagina 01/01/2013  . Depression 09/27/2012  . Obesity 09/27/2012  . Essential hypertension 07/10/2008  . ALLERGIC RHINITIS 07/10/2008  . ACQUIRED CYST OF KIDNEY 07/09/2008    Past Surgical History:  Procedure Laterality Date  . CESAREAN SECTION    . CHOLECYSTECTOMY    . ingrown toe nail removal     . ovarian cyst removal     . TONSILLECTOMY    . WISDOM TOOTH EXTRACTION      OB History    Gravida Para Term Preterm AB Living   5 3     2 3    SAB TAB Ectopic Multiple Live Births   2       3  Home Medications    Prior to Admission medications   Medication Sig Start Date End Date Taking? Authorizing Provider  ALPRAZolam (XANAX) 0.5 MG tablet TAKE 1 TABLET BY MOUTH 3 TIMES A DAY AS NEEDED FOR ANXIETY 04/13/17   Florian Buff, MD  fexofenadine-pseudoephedrine (ALLEGRA-D 24) 180-240 MG per 24 hr tablet Take 1 tablet by mouth as needed.     [provider]  hydrochlorothiazide (HYDRODIURIL) 25 MG tablet Take 1 tablet (25 mg total) by mouth daily. 05/14/17   Estill Dooms, NP  lisinopril (PRINIVIL,ZESTRIL) 10 MG tablet Take 1 tablet (10 mg total) by mouth daily. 08/06/17   Estill Dooms, NP  MedroxyPROGESTERone Acetate 150 MG/ML SUSY Inject 1 mL (150 mg total) into the  muscle every 3 (three) months. 05/20/17   Florian Buff, MD  megestrol (MEGACE) 40 MG tablet TAKE 1 TABLET (40 MG TOTAL) BY MOUTH DAILY. 01/03/17   Florian Buff, MD  Melatonin 5 MG TABS Take by mouth as needed.     [provider]  metFORMIN (GLUCOPHAGE) 500 MG tablet Take 1 tablet (500 mg total) by mouth 2 (two) times daily. 05/14/17   Florian Buff, MD  Multiple Vitamin (MULTIVITAMIN) tablet Take 1 tablet by mouth daily.    [provider]  naproxen sodium (ANAPROX) 550 MG tablet TAKE ONE TABLET BY MOUTH EVERY 8-12 HOURS AS NEEDED CRAMPS 05/08/16   Derrek Monaco A, NP  phenazopyridine (PYRIDIUM) 200 MG tablet Take 1 tablet (200 mg total) by mouth 3 (three) times daily as needed for pain. 11/21/16   Estill Dooms, NP  sulfamethoxazole-trimethoprim (BACTRIM DS,SEPTRA DS) 800-160 MG tablet Take 1 tablet by mouth 2 (two) times daily. 11/21/16   Estill Dooms, NP  UNABLE TO FIND 2 (two) times daily. Claritin nasal spray    [provider]    Family History Family History  Problem Relation Age of Onset  . Diabetes Mother   . Hypertension Mother   . Heart attack Father   . Hypertension Father   . Depression Sister   . Diabetes Maternal Grandmother   . Coronary artery disease Maternal Grandmother   . Diabetes Maternal Grandfather   . Coronary artery disease Maternal Grandfather   . Coronary artery disease Paternal Grandmother   . Diabetes Paternal Grandmother   . Coronary artery disease Paternal Grandfather     Social History Social History   Tobacco Use  . Smoking status: Never Smoker  . Smokeless tobacco: Never Used  Substance Use Topics  . Alcohol use: Yes    Alcohol/week: 0.0 oz    Comment: rarely  . Drug use: No     Allergies   Other   Review of Systems Review of Systems  Constitutional: Negative for chills and fever.  HENT: Negative for ear pain and sore throat.   Eyes: Negative for pain and visual disturbance.  Respiratory:  Positive for shortness of breath. Negative for cough and hemoptysis.   Cardiovascular: Positive for chest pain. Negative for palpitations.  Gastrointestinal: Positive for nausea. Negative for abdominal pain and vomiting.  Genitourinary: Negative for dysuria and hematuria.  Musculoskeletal: Negative for arthralgias and back pain.  Skin: Negative for color change and rash.  Neurological: Negative for seizures and syncope.  All other systems reviewed and are negative.    Physical Exam Updated Vital Signs BP (!) 127/99 (BP Location: Right Arm)   Pulse (!) 106   Temp 98 F (36.7 C) (Oral)   Resp 18  Ht 5' 9.5" (1.765 m)   Wt (!) 146.5 kg (323 lb)   LMP 06/01/2017   SpO2 100%   BMI 47.01 kg/m   Physical Exam  Constitutional: She appears well-developed and well-nourished. No distress.  HENT:  Head: Normocephalic and atraumatic.  Eyes: Conjunctivae are normal.  Neck: Neck supple.  Cardiovascular: Regular rhythm. Tachycardia present.  No murmur heard. Pulmonary/Chest: Effort normal and breath sounds normal. No respiratory distress.  Abdominal: Soft. There is no tenderness.  Musculoskeletal: She exhibits no edema.       Right lower leg: She exhibits no tenderness.       Left lower leg: She exhibits no tenderness.  Neurological: She is alert.  Skin: Skin is warm and dry. Capillary refill takes less than 2 seconds.  Psychiatric: She has a normal mood and affect.  Nursing note and vitals reviewed.    ED Treatments / Results  Labs (all labs ordered are listed, but only abnormal results are displayed) Labs Reviewed  BASIC METABOLIC PANEL - Abnormal; Notable for the following components:      Result Value   CO2 18 (*)    Glucose, Bld 186 (*)    BUN 22 (*)    Creatinine, Ser 1.05 (*)    All other components within normal limits  CBC - Abnormal; Notable for the following components:   WBC 12.5 (*)    RBC 5.26 (*)    All other components within normal limits  D-DIMER,  QUANTITATIVE (NOT AT [facility])  I-STAT TROPONIN, ED  I-STAT BETA HCG BLOOD, ED ([facility], [facility], [facility] ONLY)  I-STAT TROPONIN, ED    EKG  EKG Interpretation  Date/Time:  Friday August 17 2017 13:15:52 EST Ventricular Rate:  115 PR Interval:  122 QRS Duration: 88 QT Interval:  348 QTC Calculation: 481 R Axis:   68 Text Interpretation:  Sinus tachycardia Anterior infarct , age undetermined Abnormal ECG Nonspecific ST abnormality compared with 5/08 Confirmed by Aletta Edouard [phone]) on 08/17/2017 7:51:51 PM       Radiology Dg Chest 2 View  Result Date: 08/17/2017 CLINICAL DATA:  42 year old with midline chest heaviness and shortness of breath. EXAM: CHEST  2 VIEW COMPARISON:  01/12/2008 FINDINGS: The heart size and mediastinal contours are within normal limits. Both lungs are clear. The visualized skeletal structures are unremarkable. IMPRESSION: No active cardiopulmonary disease. Electronically Signed   By: Markus Daft M.D.   On: 08/17/2017 14:18    Procedures Procedures (including critical care time)  Medications Ordered in ED Medications - No data to display   Initial Impression / Assessment and Plan / ED Course  I have reviewed the triage vital signs and the nursing notes.  Pertinent labs & imaging results that were available during my care of the patient were reviewed by me and considered in my medical decision making (see chart for details).  Clinical Course as of Aug 18 1101  Fri Aug 17, 2017  2008 Patient is satting 100% but is persistently tachycardic here.  She gives a good story for dyspnea on exertion and her husband states she cannot even walk across the kitchen without laboring for breathing.  Her chest x-ray and initial troponin are negative.  I discussed with her the possibility of a PE and she would like to undergo d-dimer testing with the CT if positive.  [MB]  2101 Patient's d-dimer negative here and her second troponin negative.  We discussed her results and she understands  that she is going to need to  followup with her pcp. Marland Kitchen  She will return if any acute worsening.  [MB]    Clinical Course User Index [MB] Hayden Rasmussen, MD      Final Clinical Impressions(s) / ED Diagnoses   Final diagnoses:  Shortness of breath  Tachycardia  Fatigue, unspecified type    ED Discharge Orders    None       Hayden Rasmussen, MD 08/18/17 1104

## 2017-08-21 ENCOUNTER — Telehealth: Payer: Self-pay | Admitting: *Deleted

## 2017-08-22 NOTE — Telephone Encounter (Signed)
LMOVM for patient to return my call

## 2017-10-14 ENCOUNTER — Other Ambulatory Visit: Payer: Self-pay | Admitting: Obstetrics & Gynecology

## 2018-01-31 ENCOUNTER — Other Ambulatory Visit: Payer: Self-pay | Admitting: Obstetrics & Gynecology

## 2018-04-05 ENCOUNTER — Other Ambulatory Visit: Payer: Self-pay | Admitting: Obstetrics & Gynecology

## 2018-04-11 ENCOUNTER — Ambulatory Visit: Payer: BLUE CROSS/BLUE SHIELD | Admitting: Obstetrics and Gynecology

## 2018-04-29 ENCOUNTER — Other Ambulatory Visit: Payer: Self-pay | Admitting: Obstetrics & Gynecology

## 2018-06-03 ENCOUNTER — Ambulatory Visit: Payer: Self-pay | Admitting: Adult Health

## 2018-11-06 ENCOUNTER — Encounter: Payer: Self-pay | Admitting: *Deleted

## 2018-11-07 ENCOUNTER — Ambulatory Visit (INDEPENDENT_AMBULATORY_CARE_PROVIDER_SITE_OTHER): Payer: BLUE CROSS/BLUE SHIELD | Admitting: Adult Health

## 2018-11-07 ENCOUNTER — Other Ambulatory Visit: Payer: Self-pay

## 2018-11-07 ENCOUNTER — Encounter: Payer: Self-pay | Admitting: Adult Health

## 2018-11-07 VITALS — BP 154/96 | HR 93 | Temp 98.9°F | Ht 70.0 in | Wt 317.5 lb

## 2018-11-07 DIAGNOSIS — Z3009 Encounter for other general counseling and advice on contraception: Secondary | ICD-10-CM

## 2018-11-07 NOTE — Progress Notes (Signed)
Patient ID: Krista [name], female   DOB: 1975-08-09, 43 y.o.   MRN: [id number] History of Present Illness: Krista [name] is a 43 year old white female, married, [id number], in to discuss getting IUD, is on depo.When not on depo, periods heavy.She is trying to lose weight and hopes if comes off depo and will be easier. PCP is Nicholes Rough PA.    Current Medications, Allergies, Past Medical History, Past Surgical History, Family History and Social History were reviewed in [employer] record.     Review of Systems: Periods heavy when not on depo, last depo in April     Physical Exam:BP (!) 154/96 (BP Location: Right Arm, Patient Position: Sitting, Cuff Size: Large)   Pulse 93   Temp 98.9 F (37.2 C)   Ht 5\' 10"  (1.778 m)   Wt (!) 317 lb 8 oz (144 kg)   BMI 45.56 kg/m  General:  Well developed, well nourished, no acute distress Skin:  Warm and dry Neck:  Midline trachea, normal thyroid, good ROM, no lymphadenopathy Lungs; Clear to auscultation bilaterally Cardiovascular: Regular rate and rhythm Psych:  No mood changes, alert and cooperative,seems happy Fall risk is low. Discussed IUD risk and benefits and she wants to try it.   Impression and Plan: 1. Contraceptive education -review handout on mirena, will get insurance checked for IUD -return in about 5 weeks for pap and physical

## 2018-12-26 ENCOUNTER — Other Ambulatory Visit: Payer: Self-pay

## 2018-12-26 ENCOUNTER — Encounter: Payer: Self-pay | Admitting: Adult Health

## 2018-12-26 ENCOUNTER — Ambulatory Visit (INDEPENDENT_AMBULATORY_CARE_PROVIDER_SITE_OTHER): Payer: BLUE CROSS/BLUE SHIELD | Admitting: Adult Health

## 2018-12-26 ENCOUNTER — Other Ambulatory Visit (HOSPITAL_COMMUNITY): Admission: RE | Admit: 2018-12-26 | Payer: BLUE CROSS/BLUE SHIELD | Source: Ambulatory Visit

## 2018-12-26 VITALS — BP 135/76 | HR 102 | Ht 69.2 in | Wt 301.8 lb

## 2018-12-26 DIAGNOSIS — Z1212 Encounter for screening for malignant neoplasm of rectum: Secondary | ICD-10-CM

## 2018-12-26 DIAGNOSIS — R195 Other fecal abnormalities: Secondary | ICD-10-CM

## 2018-12-26 DIAGNOSIS — N72 Inflammatory disease of cervix uteri: Secondary | ICD-10-CM | POA: Insufficient documentation

## 2018-12-26 DIAGNOSIS — Z01419 Encounter for gynecological examination (general) (routine) without abnormal findings: Secondary | ICD-10-CM | POA: Insufficient documentation

## 2018-12-26 DIAGNOSIS — N888 Other specified noninflammatory disorders of cervix uteri: Secondary | ICD-10-CM

## 2018-12-26 DIAGNOSIS — Z1211 Encounter for screening for malignant neoplasm of colon: Secondary | ICD-10-CM

## 2018-12-26 LAB — HEMOCCULT GUIAC POC 1CARD (OFFICE): Fecal Occult Blood, POC: POSITIVE — AB

## 2018-12-26 MED ORDER — HYDROXYZINE HCL 10 MG PO TABS: 10.0000 mg | ORAL_TABLET | Freq: Three times a day (TID) | ORAL | 0 refills | Status: DC | PRN

## 2018-12-26 MED ORDER — METRONIDAZOLE 0.75 % VA GEL: 1.0000 | Freq: Two times a day (BID) | VAGINAL | 0 refills | Status: DC

## 2018-12-26 NOTE — Progress Notes (Addendum)
Patient ID: Krista [name], female   DOB: Apr 02, 1976, 43 y.o.   MRN: [id number] History of Present Illness: Krista [name] is a 43 year old white female, married, [id number], in for a well woman gyn exam and pap. PCP is Nicholes Rough PA.    Current Medications, Allergies, Past Medical History, Past Surgical History, Family History and Social History were reviewed in [employer] record.     Review of Systems:  Patient denies any headaches, hearing loss, fatigue, blurred vision, shortness of breath, chest pain, abdominal pain, problems with bowel movements, urination, or intercourse. No joint pain or mood swings. Last depo 3 months ago, is thinking IUD vs ablation  Feels anxious and agitated in afternoons   Physical Exam:BP 135/76 (BP Location: Right Arm, Patient Position: Sitting, Cuff Size: Large)   Pulse (!) 102   Ht 5' 9.2" (1.758 m)   Wt (!) 301 lb 12.8 oz (136.9 kg)   BMI 44.31 kg/m  General:  Well developed, well nourished, no acute distress Skin:  Warm and dry Neck:  Midline trachea, normal thyroid, good ROM, no lymphadenopathy Lungs; Clear to auscultation bilaterally Breast:  No dominant palpable mass, retraction, or nipple discharge Cardiovascular: Regular rate and rhythm Abdomen:  Soft, non tender, no hepatosplenomegaly Pelvic:  External genitalia is normal in appearance, no lesions.  The vagina is normal in appearance. Urethra has no lesions or masses. The cervix is bulbous, everted at os and has nabothian cyst.Pap with HPV and GC/CHL performed and cervix bleed. Uterus is felt to be normal size, shape, and contour.  No adnexal masses or tenderness noted.Bladder is non tender, no masses felt. Rectal: Good sphincter tone, no polyps, or hemorrhoids felt.  Hemoccult positive.(had hard BM this am but did not see any blood) Extremities/musculoskeletal:  No swelling or varicosities noted, no clubbing or cyanosis Psych:  No mood changes, alert and cooperative,seems  happy PHQ 9 score 11, denies being suicidal and is on meds Fall risk is low. Examination chaperoned by Levy Pupa LPN Will send 3 hemoccult cards home to do with diet restrictions.   Impression: 1. Encounter for gynecological examination with Papanicolaou smear of cervix   2. Screening for colorectal cancer   3. Friable cervix   4. Positive fecal occult blood test   5. Cervicitis       Plan: Pap with HPV and GC/CHL sent 3 hemoccult cards sent  Will rx metrogel Will try vistaril for when feeling anxious and drin protein shake at 2 pm daily to see if helps Meds ordered this encounter  Medications  . metroNIDAZOLE (METROGEL) 0.75 % vaginal gel    Sig: Place 1 Applicatorful vaginally 2 (two) times daily.    Dispense:  70 g    Refill:  0    Order Specific Question:   Supervising Provider    Answer:   EURE, LUTHER H [[id number]]  . hydrOXYzine (ATARAX/VISTARIL) 10 MG tablet    Sig: Take 1 tablet (10 mg total) by mouth 3 (three) times daily as needed.    Dispense:  30 tablet    Refill:  0    Order Specific Question:   Supervising Provider    Answer:   Tania Ade H [[id number]]  Follow up with me in about 11 days Will cancel IUD for tomorrow Physical in 1 year Get mammogram  Pap in 3 if normal Labs with PCP  Review handouts on ablation and hysterectomy

## 2018-12-26 NOTE — Addendum Note (Signed)
Addended by: Diona Fanti A on: 12/26/2018 02:52 PM   Modules accepted: Orders

## 2018-12-27 ENCOUNTER — Ambulatory Visit: Payer: BLUE CROSS/BLUE SHIELD | Admitting: Women's Health

## 2018-12-31 LAB — CYTOLOGY - PAP
Chlamydia: NEGATIVE
Diagnosis: NEGATIVE
HPV: NOT DETECTED
Neisseria Gonorrhea: NEGATIVE

## 2019-01-06 ENCOUNTER — Encounter: Payer: Self-pay | Admitting: Adult Health

## 2019-01-06 ENCOUNTER — Other Ambulatory Visit: Payer: Self-pay

## 2019-01-06 ENCOUNTER — Ambulatory Visit (INDEPENDENT_AMBULATORY_CARE_PROVIDER_SITE_OTHER): Payer: BLUE CROSS/BLUE SHIELD | Admitting: Adult Health

## 2019-01-06 VITALS — BP 160/82 | HR 108 | Ht 69.5 in | Wt 307.2 lb

## 2019-01-06 DIAGNOSIS — Z1212 Encounter for screening for malignant neoplasm of rectum: Secondary | ICD-10-CM

## 2019-01-06 DIAGNOSIS — N946 Dysmenorrhea, unspecified: Secondary | ICD-10-CM

## 2019-01-06 DIAGNOSIS — Z1211 Encounter for screening for malignant neoplasm of colon: Secondary | ICD-10-CM

## 2019-01-06 DIAGNOSIS — D219 Benign neoplasm of connective and other soft tissue, unspecified: Secondary | ICD-10-CM | POA: Diagnosis not present

## 2019-01-06 DIAGNOSIS — N920 Excessive and frequent menstruation with regular cycle: Secondary | ICD-10-CM | POA: Diagnosis not present

## 2019-01-06 LAB — HEMOCCULT GUIAC POC 1CARD (OFFICE)
Card #2 Fecal Occult Blod, POC: NEGATIVE
Card #3 Fecal Occult Blood, POC: NEGATIVE
Fecal Occult Blood, POC: NEGATIVE

## 2019-01-06 NOTE — Progress Notes (Signed)
Patient ID: Krista [name], female   DOB: 16-Oct-1975, 43 y.o.   MRN: [id number] History of Present Illness:  Krista [name] is a 43 year old white female, married, [id number], back in for cervical check after using metrogel for friable cervix and discuss IUD, ablation and [facility].She does not want to continue depo, as she is trying to lose weight, but does not want a period or cramping either.She has multiple fibroids, per [place] PCP is Nicholes Rough PA.   Current Medications, Allergies, Past Medical History, Past Surgical History, Family History and Social History were reviewed in [employer] record.     Review of Systems: Periods heavy when has them has been on depo +cramping with periods     Physical Exam:BP (!) 160/82 (BP Location: Left Arm, Patient Position: Sitting, Cuff Size: Normal)   Pulse (!) 108   Ht 5' 9.5" (1.765 m)   Wt (!) 307 lb 3.2 oz (139.3 kg)   BMI 44.72 kg/m  General:  Well developed, well nourished, no acute distress Skin:  Warm and dry Pelvic:  External genitalia is normal in appearance, no lesions.  The vagina is normal in appearance. Urethra has no lesions or masses. The cervix is bulbous, and less irritated,non friable.  Uterus is felt to be normal size, shape, and contour.  No adnexal masses or tenderness noted.Bladder is non tender, no masses felt. 3  Hemoccult cards are negative. Extremities/musculoskeletal:  No swelling or varicosities noted, no clubbing or cyanosis Psych:  No mood changes, alert and cooperative,seems happy She is aware that pap was negative for malignancy and HPV. She does not want any more kids.  Exam was performed with her permission without chaperone.  Impression: 1. Menorrhagia with regular cycle   2. Screening for colorectal cancer   3. Fibroids   4. Dysmenorrhea       Plan: Return in 1 week to talk with Dr Elonda Husky about options, including [facility]

## 2019-01-20 ENCOUNTER — Other Ambulatory Visit: Payer: Self-pay | Admitting: Obstetrics & Gynecology

## 2019-01-21 ENCOUNTER — Other Ambulatory Visit: Payer: Self-pay

## 2019-01-21 ENCOUNTER — Encounter: Payer: Self-pay | Admitting: Obstetrics & Gynecology

## 2019-01-21 ENCOUNTER — Ambulatory Visit (INDEPENDENT_AMBULATORY_CARE_PROVIDER_SITE_OTHER): Payer: BLUE CROSS/BLUE SHIELD | Admitting: Obstetrics & Gynecology

## 2019-01-21 VITALS — BP 110/80 | HR 99 | Ht 69.2 in | Wt 311.0 lb

## 2019-01-21 DIAGNOSIS — D219 Benign neoplasm of connective and other soft tissue, unspecified: Secondary | ICD-10-CM

## 2019-01-21 DIAGNOSIS — N946 Dysmenorrhea, unspecified: Secondary | ICD-10-CM

## 2019-01-21 DIAGNOSIS — N920 Excessive and frequent menstruation with regular cycle: Secondary | ICD-10-CM

## 2019-01-21 NOTE — Progress Notes (Signed)
Preoperative History and Physical  Krista [name] is a 43 y.o. [id number] with No LMP recorded. Patient has had an injection. admitted for a abdominal supracervical hysterectomy with bilateral salpingectomy for management of menorrhagia dysmenorrhea related to fibroids..  Has used depo provera for years to control menses but wants to get off of depoo for weight and bones and opts for hysterectomy over ablation which given the size of her fibroids is her best option.  Just got her last depo provera so will schedule for 02/19/2019  PMH:    Past Medical History:  Diagnosis Date  . Allergy   . Anxiety   . Asthma    childhood  . Depression   . Diabetes mellitus without complication ([place])   . Fibroids 02/13/2014  . Friable cervix 03/15/2015  . Hypertension   . Hypothyroid   . Irregular bleeding 03/15/2015  . Lyme disease   . Menometrorrhagia 02/11/2014  . Obesity   . Rosacea   . Severe menstrual cramps 09/17/2015  . Superficial fungus infection of skin 10/20/2015  . Weight gain 08/31/2015    PSH:     Past Surgical History:  Procedure Laterality Date  . CESAREAN SECTION    . CHOLECYSTECTOMY    . ingrown toe nail removal     . ovarian cyst removal     . TONSILLECTOMY    . WISDOM TOOTH EXTRACTION      POb/GynH:      OB History    Gravida  5   Para  3   Term      Preterm      AB  2   Living  3     SAB  2   TAB      Ectopic      Multiple      Live Births  3           SH:   Social History   Tobacco Use  . Smoking status: Never Smoker  . Smokeless tobacco: Never Used  Substance Use Topics  . Alcohol use: Yes    Alcohol/week: 0.0 standard drinks    Comment: rarely  . Drug use: No    FH:    Family History  Problem Relation Age of Onset  . Diabetes Mother   . Hypertension Mother   . Heart attack Father   . Hypertension Father   . Depression Sister   . Diabetes Maternal Grandmother   . Coronary artery disease Maternal Grandmother   . Diabetes  Maternal Grandfather   . Coronary artery disease Maternal Grandfather   . Coronary artery disease Paternal Grandmother   . Diabetes Paternal Grandmother   . Coronary artery disease Paternal Grandfather      Allergies:  Allergies  Allergen Reactions  . Propofol Rash    Also had itching    Medications:       Current Outpatient Medications:  .  albuterol (PROAIR HFA) 108 (90 Base) MCG/ACT inhaler, ProAir HFA 90 mcg/actuation aerosol inhaler  TAKE 2 PUFFS BY MOUTH EVERY 6 HOURS AS NEEDED FOR WHEEZE, Disp: , Rfl:  .  ALPRAZolam (XANAX) 0.5 MG tablet, TAKE 1 TABLET BY MOUTH THREE TIMES A DAY AS NEEDED FOR ANXIETY, Disp: 60 tablet, Rfl: 3 .  DULoxetine (CYMBALTA) 60 MG capsule, Take 120 mg by mouth at bedtime., Disp: , Rfl:  .  fexofenadine-pseudoephedrine (ALLEGRA-D 24) 180-240 MG per 24 hr tablet, Take 1 tablet by mouth as needed. , Disp: , Rfl:  .  gabapentin (  NEURONTIN) 100 MG capsule, Take 100 mg by mouth as needed. , Disp: , Rfl:  .  hydrOXYzine (ATARAX/VISTARIL) 10 MG tablet, Take 1 tablet (10 mg total) by mouth 3 (three) times daily as needed., Disp: 30 tablet, Rfl: 0 .  lisinopril (PRINIVIL,ZESTRIL) 10 MG tablet, Take 1 tablet (10 mg total) by mouth daily. (Patient taking differently: Take 20 mg by mouth daily. ), Disp: 90 tablet, Rfl: 3 .  meclizine (ANTIVERT) 25 MG tablet, 25 mg as needed. , Disp: , Rfl:  .  megestrol (MEGACE) 40 MG tablet, TAKE 1 TABLET BY MOUTH EVERY DAY, Disp: 90 tablet, Rfl: 3 .  Melatonin 5 MG TABS, Take by mouth as needed. , Disp: , Rfl:  .  meloxicam (MOBIC) 15 MG tablet, TAKE 1 TABLET BY MOUTH EVERY DAY, Disp: , Rfl:  .  metFORMIN (GLUCOPHAGE) 500 MG tablet, Take 1 tablet (500 mg total) by mouth 2 (two) times daily., Disp: 180 tablet, Rfl: 3 .  montelukast (SINGULAIR) 10 MG tablet, Take by mouth., Disp: , Rfl:  .  oxymetazoline (AFRIN) 0.05 % nasal spray, Place into the nose., Disp: , Rfl:  .  hydrochlorothiazide (HYDRODIURIL) 25 MG tablet, Take 1 tablet  (25 mg total) by mouth daily., Disp: 90 tablet, Rfl: 2 .  lisdexamfetamine (VYVANSE) 70 MG capsule, Take by mouth daily. , Disp: , Rfl:  .  naproxen sodium (ANAPROX) 550 MG tablet, TAKE ONE TABLET BY MOUTH EVERY 8-12 HOURS AS NEEDED CRAMPS, Disp: 30 tablet, Rfl: 2 .  phenazopyridine (PYRIDIUM) 200 MG tablet, Take 1 tablet (200 mg total) by mouth 3 (three) times daily as needed for pain. (Patient not taking: Reported on 01/21/2019), Disp: 10 tablet, Rfl: 0  Review of Systems:   Review of Systems  Constitutional: Negative for fever, chills, weight loss, malaise/fatigue and diaphoresis.  HENT: Negative for hearing loss, ear pain, nosebleeds, congestion, sore throat, neck pain, tinnitus and ear discharge.   Eyes: Negative for blurred vision, double vision, photophobia, pain, discharge and redness.  Respiratory: Negative for cough, hemoptysis, sputum production, shortness of breath, wheezing and stridor.   Cardiovascular: Negative for chest pain, palpitations, orthopnea, claudication, leg swelling and PND.  Gastrointestinal: Positive for abdominal pain. Negative for heartburn, nausea, vomiting, diarrhea, constipation, blood in stool and melena.  Genitourinary: Negative for dysuria, urgency, frequency, hematuria and flank pain.  Musculoskeletal: Negative for myalgias, back pain, joint pain and falls.  Skin: Negative for itching and rash.  Neurological: Negative for dizziness, tingling, tremors, sensory change, speech change, focal weakness, seizures, loss of consciousness, weakness and headaches.  Endo/Heme/Allergies: Negative for environmental allergies and polydipsia. Does not bruise/bleed easily.  Psychiatric/Behavioral: Negative for depression, suicidal ideas, hallucinations, memory loss and substance abuse. The patient is not nervous/anxious and does not have insomnia.      PHYSICAL EXAM:  Blood pressure 110/80, pulse 99, height 5' 9.2" (1.758 m), weight (!) 311 lb (141.1 kg).    Vitals  reviewed. Constitutional: She is oriented to person, place, and time. She appears well-developed and well-nourished.  HENT:  Head: Normocephalic and atraumatic.  Right Ear: External ear normal.  Left Ear: External ear normal.  Nose: Nose normal.  Mouth/Throat: Oropharynx is clear and moist.  Eyes: Conjunctivae and EOM are normal. Pupils are equal, round, and reactive to light. Right eye exhibits no discharge. Left eye exhibits no discharge. No scleral icterus.  Neck: Normal range of motion. Neck supple. No tracheal deviation present. No thyromegaly present.  Cardiovascular: Normal rate, regular rhythm, normal heart sounds  and intact distal pulses.  Exam reveals no gallop and no friction rub.   No murmur heard. Respiratory: Effort normal and breath sounds normal. No respiratory distress. She has no wheezes. She has no rales. She exhibits no tenderness.  GI: Soft. Bowel sounds are normal. She exhibits no distension and no mass. There is tenderness. There is no rebound and no guarding.  Genitourinary:       Vulva is normal without lesions Vagina is pink moist without discharge Cervix normal in appearance and pap is normal Uterus is 12 weeks size 3 fibroids Adnexa is negative with normal sized ovaries by sonogram  Musculoskeletal: Normal range of motion. She exhibits no edema and no tenderness.  Neurological: She is alert and oriented to person, place, and time. She has normal reflexes. She displays normal reflexes. No cranial nerve deficit. She exhibits normal muscle tone. Coordination normal.  Skin: Skin is warm and dry. No rash noted. No erythema. No pallor.  Psychiatric: She has a normal mood and affect. Her behavior is normal. Judgment and thought content normal.    Labs: No results found for this or any previous visit (from the past 336 hour(s)).  EKG: Orders placed or performed during the hospital encounter of 08/17/17  . EKG 12-Lead  . EKG 12-Lead  . ED EKG within 10 minutes  .  ED EKG within 10 minutes  . EKG    Imaging Studies: GYNECOLOGIC SONOGRAM   RICHARD HOLZ is a 43 y.o. [id number] LMP 10/25/2015 for a pelvic sonogram for menometrorrhagia.  Uterus                      10.6 x 6.2 x 6.4 cm, Heterogenous anteverted uterus w/mult fibroids  Endometrium          11.9 mm, symmetrical, wnl  Right ovary             2.9 x 2.2 x 1.6 cm, wnl  Left ovary                2.2 x 1.5 x 2.1 cm, wnl  No free fluid,no pain during ultrasound  fibroids (#1) calcified post rt intramural fibroid 2 x 1.4 x 1.1cm, (#2) calcified intramural fibroid post lt  .5 x .4 x .4 cm,(#3) pedunculated rt body 2.5 x 2.9 x 2.1cm   Technician Comments:  US PELVIC US TA/TV: Heterogenous anteverted uterus w/mult fibroids,(#1) calcified post rt intramural fibroid 2 x 1.4 x 1.1cm, (#2) calcified intramural fibroid post lt  .5 x .4 x .4 cm,(#3) pedunculated rt body 2.5 x 2.9 x 2.1cm, EEC 11.9 mm,normal rt ov,limited view of left ov    [employer] 12/02/2015 10:16 AM  Clinical Impression and recommendations:  I have reviewed the sonogram results above, combined with the patient's current clinical course, below are my impressions and any appropriate recommendations for management based on the sonographic findings.  Multiple very small fibroids probably not contributing to the patient's bleeding Otherwise normal sonogram   , H 12/02/2015 10:43 AM    Assessment:   ICD-10-CM   1. Fibroids  D21.9   2. Dysmenorrhea  N94.6   3. Menorrhagia with regular cycle  N92.0      Plan: Supracervical abdominal hysterectomy with removal of tubes, preservation of the ovaries 02/19/2019  Mertie Clause  01/21/2019 1:02 PM      Face to face time:  15 minutes  Greater than 50% of the visit time was spent in counseling and coordination  of care with the patient.  The summary and outline of the counseling and care coordination is summarized in the note above.    All questions were answered.

## 2019-02-07 ENCOUNTER — Other Ambulatory Visit: Payer: Self-pay

## 2019-02-07 MED ORDER — HYDROXYZINE HCL 10 MG PO TABS: 10.0000 mg | ORAL_TABLET | Freq: Three times a day (TID) | ORAL | 0 refills | Status: DC | PRN

## 2019-02-14 NOTE — Patient Instructions (Signed)
Krista [name]  02/14/2019     @PREFPERIOPPHARMACY @   Your procedure is scheduled on  02/19/2019.  Report to [facility] at  [phone]  A.M.  Call this number if you have problems the morning of surgery:  [phone]   Remember:  Do not eat or drink after midnight.                         Take these medicines the morning of surgery with A SIP OF WATER  Xanax(if needed), thyroid, cymbalta, neurontin, hydroxyzine, vyvanse, mobic(if needed).    Do not wear jewelry, make-up or nail polish.  Do not wear lotions, powders, or perfumes. Please wear deodorant and brush your teeth.  Do not shave 48 hours prior to surgery.  Men may shave face and neck.  Do not bring valuables to the hospital.  [facility] is not responsible for any belongings or valuables.  Contacts, dentures or bridgework may not be worn into surgery.  Leave your suitcase in the car.  After surgery it may be brought to your room.  For patients admitted to the hospital, discharge time will be determined by your treatment team.  Patients discharged the day of surgery will not be allowed to drive home.   Name and phone number of your driver:   family Special instructions:  None  Please read over the following fact sheets that you were given. Pain Booklet, Coughing and Deep Breathing, Blood Transfusion Information, Lab Information, Surgical Site Infection Prevention, Anesthesia Post-op Instructions and Care and Recovery After Surgery       Abdominal Hysterectomy, Care After This sheet gives you information about how to care for yourself after your procedure. Your health care provider may also give you more specific instructions. If you have problems or questions, contact your health care provider. What can I expect after the procedure? After the procedure, it is common to have:  Pain.  Tiredness (fatigue).  Poor appetite.  Lowered interest in sex.  Bleeding and discharge from your vagina. You may need  to use a pad in your underwear after this procedure. Follow these instructions at home: Medicines  Take over-the-counter and prescription medicines only as told by your doctor.  Do not take aspirin or ibuprofen. These medicines can cause bleeding.  Ask your doctor if the medicine prescribed to you: ? Requires you to avoid driving or using heavy machinery. ? Can cause trouble pooping (constipation). You may need to take these actions to prevent or treat trouble pooping:  Take over-the-counter or prescription medicines.  Eat foods that are high in fiber. These include beans, whole grains, and fresh fruits and vegetables.  Limit foods that are high in fat and processed sugars. These include fried or sweet foods. Surgical cut (incision) care      Follow instructions from your doctor about how to take care of your cut from surgery (incision). Make sure you: ? Wash your hands with soap and water before and after you change your bandage (dressing). If you cannot use soap and water, use hand sanitizer. ? Change your bandage as told by your doctor. ? Leave stitches (sutures), skin glue, or skin tape (adhesive) strips in place. They may need to stay in place for 2 weeks or longer. If tape strips get loose and curl up, you may trim the loose edges. Do not remove tape strips completely unless your doctor says it is okay.  Check your  cut from surgery every day for signs of infection. Check for: ? Redness, swelling, or pain. ? Fluid or blood. ? Warmth. ? Pus or a bad smell. Activity  Rest as told by your doctor. ? Do not sit for a long time without moving. Get up to take short walks every 1-2 hours. This is important. Ask for help if you feel weak or unsteady.  Do not lift anything that is heavier than 10 lb (4.5 kg), or the limit that you are told, until your doctor says that it is safe.  Do not drive or use heavy machinery while taking prescription pain medicine.  Follow your doctor's  advice about exercise, driving, and general activities. Return to your normal activities as told by your doctor. Ask your doctor what activities are safe for you. Lifestyle  Do not douche, use tampons, or have sex for at least 6 weeks or as told by your doctor.  Do not drink alcohol until your doctor says it is okay.  Do not use any products that contain nicotine or tobacco, such as cigarettes, e-cigarettes, and chewing tobacco. If you need help quitting, ask your doctor. General instructions   Drink enough fluid to keep your pee (urine) pale yellow.  Do not take baths, swim, or use a hot tub until your doctor approves. Ask your doctor if you may take showers. You may only be allowed to take sponge baths.  Try to have someone at home with you for the first 1-2 weeks to help you with your daily chores at home.  Keep the bandage dry until your doctor says it can be taken off.  Wear tight-fitting (compression) stockings as told by your health care provider. These stockings help to prevent blood clots and reduce swelling in your legs.  Keep all follow-up visits as told by your doctor. This is important. Contact a doctor if:  You have any of these signs of infection: ? Redness, swelling, or pain around your cut. ? Fluid or blood coming from your cut. ? Warmth coming from your cut. ? Pus or a bad smell coming from your cut. ? Chills or a fever.  Your cut breaks open.  You feel dizzy or light-headed.  You have pain or bleeding when you pee.  You keep having watery poop (diarrhea).  You keep feeling like you may vomit (nauseous) or keep vomiting.  You have unusual fluid (discharge) coming from your vagina.  You have a rash.  You have any type of reaction to your medicine that is not normal, or you develop an allergy to your medicine.  Your pain medicine does not help. Get help right away if:  You have a fever and your symptoms get worse all of a sudden.  You have very bad  pain in your belly (abdomen).  You are short of breath.  You faint.  You have pain, swelling, or redness of your leg.  You bleed a lot from your vagina and notice clumps of blood (clots). Summary  Do not take baths, swim, or use a hot tub until your doctor approves. Ask your doctor if you may take showers. You may only be allowed to take sponge baths.  Do not lift anything that is heavier than 10 lb (4.5 kg), or the limit that you are told, until your doctor says that it is safe.  Follow your doctor's advice about exercise, driving, and general activities. Ask your doctor what activities are safe for you.  Try to have  someone at home with you for the first 1-2 weeks to help you with your daily chores at home. This information is not intended to replace advice given to you by your health care provider. Make sure you discuss any questions you have with your health care provider. Document Released: 03/14/2008 Document Revised: 08/08/2018 Document Reviewed: 05/24/2016 Elsevier Patient Education  [place]. How to Use Chlorhexidine for Bathing Chlorhexidine gluconate (CHG) is a germ-killing (antiseptic) solution that is used to clean the skin. It can get rid of the bacteria that normally live on the skin and can keep them away for about 24 hours. To clean your skin with CHG, you may be given:  A CHG solution to use in the shower or as part of a sponge bath.  A prepackaged cloth that contains CHG. Cleaning your skin with CHG may help lower the risk for infection:  While you are staying in the intensive care unit of the hospital.  If you have a vascular access, such as a central line, to provide short-term or long-term access to your veins.  If you have a catheter to drain urine from your bladder.  If you are on a ventilator. A ventilator is a machine that helps you breathe by moving air in and out of your lungs.  After surgery. What are the risks? Risks of using CHG include:   A skin reaction.  Hearing loss, if CHG gets in your ears.  Eye injury, if CHG gets in your eyes and is not rinsed out.  The CHG product catching fire. Make sure that you avoid smoking and flames after applying CHG to your skin. Do not use CHG:  If you have a chlorhexidine allergy or have previously reacted to chlorhexidine.  On babies younger than 49 months of age. How to use CHG solution  Use CHG only as told by your health care provider, and follow the instructions on the label.  Use the full amount of CHG as directed. Usually, this is one bottle. During a shower Follow these steps when using CHG solution during a shower (unless your health care provider gives you different instructions): 1. Start the shower. 2. Use your normal soap and shampoo to wash your face and hair. 3. Turn off the shower or move out of the shower stream. 4. Pour the CHG onto a clean washcloth. Do not use any type of brush or rough-edged sponge. 5. Starting at your neck, lather your body down to your toes. Make sure you follow these instructions: ? If you will be having surgery, pay special attention to the part of your body where you will be having surgery. Scrub this area for at least 1 minute. ? Do not use CHG on your head or face. If the solution gets into your ears or eyes, rinse them well with water. ? Avoid your genital area. ? Avoid any areas of skin that have broken skin, cuts, or scrapes. ? Scrub your back and under your arms. Make sure to wash skin folds. 6. Let the lather sit on your skin for 1-2 minutes or as long as told by your health care provider. 7. Thoroughly rinse your entire body in the shower. Make sure that all body creases and crevices are rinsed well. 8. Dry off with a clean towel. Do not put any substances on your body afterward-such as powder, lotion, or perfume-unless you are told to do so by your health care provider. Only use lotions that are recommended by the  manufacturer. 9.  Put on clean clothes or pajamas. 10. If it is the night before your surgery, sleep in clean sheets.  During a sponge bath Follow these steps when using CHG solution during a sponge bath (unless your health care provider gives you different instructions): 1. Use your normal soap and shampoo to wash your face and hair. 2. Pour the CHG onto a clean washcloth. 3. Starting at your neck, lather your body down to your toes. Make sure you follow these instructions: ? If you will be having surgery, pay special attention to the part of your body where you will be having surgery. Scrub this area for at least 1 minute. ? Do not use CHG on your head or face. If the solution gets into your ears or eyes, rinse them well with water. ? Avoid your genital area. ? Avoid any areas of skin that have broken skin, cuts, or scrapes. ? Scrub your back and under your arms. Make sure to wash skin folds. 4. Let the lather sit on your skin for 1-2 minutes or as long as told by your health care provider. 5. Using a different clean, wet washcloth, thoroughly rinse your entire body. Make sure that all body creases and crevices are rinsed well. 6. Dry off with a clean towel. Do not put any substances on your body afterward-such as powder, lotion, or perfume-unless you are told to do so by your health care provider. Only use lotions that are recommended by the manufacturer. 7. Put on clean clothes or pajamas. 8. If it is the night before your surgery, sleep in clean sheets. How to use CHG prepackaged cloths  Only use CHG cloths as told by your health care provider, and follow the instructions on the label.  Use the CHG cloth on clean, dry skin.  Do not use the CHG cloth on your head or face unless your health care provider tells you to.  When washing with the CHG cloth: ? Avoid your genital area. ? Avoid any areas of skin that have broken skin, cuts, or scrapes. Before surgery Follow these steps when using a CHG cloth  to clean before surgery (unless your health care provider gives you different instructions): 1. Using the CHG cloth, vigorously scrub the part of your body where you will be having surgery. Scrub using a back-and-forth motion for 3 minutes. The area on your body should be completely wet with CHG when you are done scrubbing. 2. Do not rinse. Discard the cloth and let the area air-dry. Do not put any substances on the area afterward, such as powder, lotion, or perfume. 3. Put on clean clothes or pajamas. 4. If it is the night before your surgery, sleep in clean sheets.  For general bathing Follow these steps when using CHG cloths for general bathing (unless your health care provider gives you different instructions). 1. Use a separate CHG cloth for each area of your body. Make sure you wash between any folds of skin and between your fingers and toes. Wash your body in the following order, switching to a new cloth after each step: ? The front of your neck, shoulders, and chest. ? Both of your arms, under your arms, and your hands. ? Your stomach and groin area, avoiding the genitals. ? Your right leg and foot. ? Your left leg and foot. ? The back of your neck, your back, and your buttocks. 2. Do not rinse. Discard the cloth and let the area air-dry. Do not  put any substances on your body afterward-such as powder, lotion, or perfume-unless you are told to do so by your health care provider. Only use lotions that are recommended by the manufacturer. 3. Put on clean clothes or pajamas. Contact a health care provider if:  Your skin gets irritated after scrubbing.  You have questions about using your solution or cloth. Get help right away if:  Your eyes become very red or swollen.  Your eyes itch badly.  Your skin itches badly and is red or swollen.  Your hearing changes.  You have trouble seeing.  You have swelling or tingling in your mouth or throat.  You have trouble breathing.  You  swallow any chlorhexidine. Summary  Chlorhexidine gluconate (CHG) is a germ-killing (antiseptic) solution that is used to clean the skin. Cleaning your skin with CHG may help to lower your risk for infection.  You may be given CHG to use for bathing. It may be in a bottle or in a prepackaged cloth to use on your skin. Carefully follow your health care provider's instructions and the instructions on the product label.  Do not use CHG if you have a chlorhexidine allergy.  Contact your health care provider if your skin gets irritated after scrubbing. This information is not intended to replace advice given to you by your health care provider. Make sure you discuss any questions you have with your health care provider. Document Released: 02/28/2012 Document Revised: 08/22/2018 Document Reviewed: 05/03/2017 Elsevier Patient Education  2020 [employer].

## 2019-02-17 ENCOUNTER — Encounter (HOSPITAL_COMMUNITY)
Admission: RE | Admit: 2019-02-17 | Discharge: 2019-02-17 | Disposition: A | Discharge status: Home / Self Care | Payer: BLUE CROSS/BLUE SHIELD | Source: Ambulatory Visit | Attending: Obstetrics & Gynecology | Admitting: Obstetrics & Gynecology

## 2019-02-17 ENCOUNTER — Other Ambulatory Visit: Payer: Self-pay

## 2019-02-17 ENCOUNTER — Other Ambulatory Visit (HOSPITAL_COMMUNITY)
Admission: RE | Admit: 2019-02-17 | Discharge: 2019-02-17 | Disposition: A | Discharge status: Home / Self Care | Payer: BLUE CROSS/BLUE SHIELD | Source: Ambulatory Visit | Attending: Obstetrics & Gynecology | Admitting: Obstetrics & Gynecology

## 2019-02-17 ENCOUNTER — Encounter (HOSPITAL_COMMUNITY): Payer: Self-pay

## 2019-02-17 DIAGNOSIS — Z01812 Encounter for preprocedural laboratory examination: Secondary | ICD-10-CM | POA: Diagnosis not present

## 2019-02-17 DIAGNOSIS — Z20822 Contact with and (suspected) exposure to covid-19: Secondary | ICD-10-CM

## 2019-02-17 DIAGNOSIS — Z20828 Contact with and (suspected) exposure to other viral communicable diseases: Secondary | ICD-10-CM | POA: Insufficient documentation

## 2019-02-17 HISTORY — DX: Unspecified osteoarthritis, unspecified site: M19.90

## 2019-02-17 LAB — HCG, QUANTITATIVE, PREGNANCY: hCG, Beta Chain, Quant, S: <1 m[IU]/mL (ref <5)

## 2019-02-17 LAB — URINALYSIS, ROUTINE W REFLEX MICROSCOPIC
Bilirubin Urine: NEGATIVE
Glucose, UA: >=500 mg/dL — AB
Hgb urine dipstick: NEGATIVE
Ketones, ur: NEGATIVE mg/dL
Nitrite: NEGATIVE
Protein, ur: 30 mg/dL — AB
Specific Gravity, Urine: 1.025 (ref 1.005–1.030)
pH: 5 (ref 5.0–8.0)

## 2019-02-17 LAB — CBC
HCT: 41.4 % (ref 36.0–46.0)
Hemoglobin: 12.9 g/dL (ref 12.0–15.0)
MCH: 26.7 pg (ref 26.0–34.0)
MCHC: 31.2 g/dL (ref 30.0–36.0)
MCV: 85.7 fL (ref 80.0–100.0)
Platelets: 246 10*3/uL (ref 150–400)
RBC: 4.83 MIL/uL (ref 3.87–5.11)
RDW: 13.6 % (ref 11.5–15.5)
WBC: 7.7 10*3/uL (ref 4.0–10.5)
nRBC: 0 % (ref 0.0–0.2)

## 2019-02-17 LAB — SARS CORONAVIRUS 2 (TAT 6-24 HRS): SARS Coronavirus 2: NEGATIVE

## 2019-02-17 LAB — COMPREHENSIVE METABOLIC PANEL
ALT: 27 U/L (ref 0–44)
AST: 16 U/L (ref 15–41)
Albumin: 4.1 g/dL (ref 3.5–5.0)
Alkaline Phosphatase: 49 U/L (ref 38–126)
Anion gap: 11 (ref 5–15)
BUN: 18 mg/dL (ref 6–20)
CO2: 21 mmol/L — ABNORMAL LOW (ref 22–32)
Calcium: 9 mg/dL (ref 8.9–10.3)
Chloride: 107 mmol/L (ref 98–111)
Creatinine, Ser: 0.95 mg/dL (ref 0.44–1.00)
GFR calc Af Amer: >60 mL/min (ref >60)
GFR calc non Af Amer: >60 mL/min (ref >60)
Glucose, Bld: 241 mg/dL — ABNORMAL HIGH (ref 70–99)
Potassium: 3.5 mmol/L (ref 3.5–5.1)
Sodium: 139 mmol/L (ref 135–145)
Total Bilirubin: 0.5 mg/dL (ref 0.3–1.2)
Total Protein: 7 g/dL (ref 6.5–8.1)

## 2019-02-17 LAB — TYPE AND SCREEN
ABO/RH(D): A POS
Antibody Screen: NEGATIVE

## 2019-02-17 LAB — HEMOGLOBIN A1C
Hgb A1c MFr Bld: 7.5 % — ABNORMAL HIGH (ref 4.8–5.6)
Mean Plasma Glucose: 168.55 mg/dL

## 2019-02-17 LAB — GLUCOSE, CAPILLARY: Glucose-Capillary: 237 mg/dL — ABNORMAL HIGH (ref 70–99)

## 2019-02-17 LAB — RAPID HIV SCREEN (HIV 1/2 AB+AG)
HIV 1/2 Antibodies: NONREACTIVE
HIV-1 P24 Antigen - HIV24: NONREACTIVE

## 2019-02-18 NOTE — Pre-Procedure Instructions (Signed)
HgbA1c routed to PCP. 

## 2019-02-19 ENCOUNTER — Inpatient Hospital Stay (HOSPITAL_COMMUNITY): Payer: BLUE CROSS/BLUE SHIELD | Admitting: Anesthesiology

## 2019-02-19 ENCOUNTER — Other Ambulatory Visit: Payer: Self-pay

## 2019-02-19 ENCOUNTER — Encounter (HOSPITAL_COMMUNITY)
Admission: RE | Disposition: A | Discharge status: Home / Self Care | Payer: Self-pay | Source: Home / Self Care | Attending: Obstetrics & Gynecology

## 2019-02-19 ENCOUNTER — Encounter (HOSPITAL_COMMUNITY): Payer: Self-pay | Admitting: *Deleted

## 2019-02-19 ENCOUNTER — Inpatient Hospital Stay (HOSPITAL_COMMUNITY)
Admission: RE | Admit: 2019-02-19 | Discharge: 2019-02-20 | DRG: 743 | Disposition: A | Discharge status: Home / Self Care | Payer: BLUE CROSS/BLUE SHIELD | Attending: Obstetrics & Gynecology | Admitting: Obstetrics & Gynecology

## 2019-02-19 DIAGNOSIS — D259 Leiomyoma of uterus, unspecified: Principal | ICD-10-CM | POA: Diagnosis present

## 2019-02-19 DIAGNOSIS — Z833 Family history of diabetes mellitus: Secondary | ICD-10-CM

## 2019-02-19 DIAGNOSIS — Z8249 Family history of ischemic heart disease and other diseases of the circulatory system: Secondary | ICD-10-CM | POA: Diagnosis not present

## 2019-02-19 DIAGNOSIS — N946 Dysmenorrhea, unspecified: Secondary | ICD-10-CM | POA: Diagnosis present

## 2019-02-19 DIAGNOSIS — N920 Excessive and frequent menstruation with regular cycle: Secondary | ICD-10-CM | POA: Diagnosis present

## 2019-02-19 DIAGNOSIS — Z90710 Acquired absence of both cervix and uterus: Secondary | ICD-10-CM | POA: Diagnosis present

## 2019-02-19 HISTORY — PX: SUPRACERVICAL ABDOMINAL HYSTERECTOMY: SHX5393

## 2019-02-19 HISTORY — PX: BILATERAL SALPINGECTOMY: SHX5743

## 2019-02-19 LAB — GLUCOSE, CAPILLARY
Glucose-Capillary: 164 mg/dL — ABNORMAL HIGH (ref 70–99)
Glucose-Capillary: 173 mg/dL — ABNORMAL HIGH (ref 70–99)

## 2019-02-19 SURGERY — HYSTERECTOMY, SUPRACERVICAL, ABDOMINAL
Anesthesia: General | Site: Abdomen

## 2019-02-19 MED ORDER — LISINOPRIL 10 MG PO TABS
20.0000 mg | ORAL_TABLET | Freq: Every day | ORAL | Status: DC
  Administered 2019-02-19 – 2019-02-20 (×2): 20 mg via ORAL
  Filled 2019-02-19 (×2): qty 2

## 2019-02-19 MED ORDER — HYDROXYZINE HCL 10 MG PO TABS: 10.0000 mg | ORAL_TABLET | Freq: Three times a day (TID) | ORAL | Status: DC | PRN

## 2019-02-19 MED ORDER — PROPOFOL 10 MG/ML IV BOLUS
INTRAVENOUS | Status: AC
  Filled 2019-02-19: qty 40

## 2019-02-19 MED ORDER — KETOROLAC TROMETHAMINE 30 MG/ML IJ SOLN
30.0000 mg | Freq: Four times a day (QID) | INTRAMUSCULAR | Status: AC
  Administered 2019-02-19 – 2019-02-20 (×4): 30 mg via INTRAVENOUS
  Filled 2019-02-19 (×4): qty 1

## 2019-02-19 MED ORDER — ALPRAZOLAM 0.5 MG PO TABS
0.5000 mg | ORAL_TABLET | Freq: Three times a day (TID) | ORAL | Status: DC | PRN
  Administered 2019-02-19: 0.5 mg via ORAL
  Filled 2019-02-19: qty 1

## 2019-02-19 MED ORDER — LIDOCAINE 2% (20 MG/ML) 5 ML SYRINGE
INTRAMUSCULAR | Status: AC
  Filled 2019-02-19: qty 5

## 2019-02-19 MED ORDER — METFORMIN HCL 500 MG PO TABS
500.0000 mg | ORAL_TABLET | Freq: Two times a day (BID) | ORAL | Status: DC
  Administered 2019-02-19 – 2019-02-20 (×2): 500 mg via ORAL
  Filled 2019-02-19 (×2): qty 1

## 2019-02-19 MED ORDER — CEFAZOLIN SODIUM-DEXTROSE 2-4 GM/100ML-% IV SOLN
INTRAVENOUS | Status: AC
  Filled 2019-02-19: qty 100

## 2019-02-19 MED ORDER — ROCURONIUM 10MG/ML (10ML) SYRINGE FOR MEDFUSION PUMP - OPTIME
INTRAVENOUS | Status: DC | PRN
  Administered 2019-02-19: 20 mg via INTRAVENOUS
  Administered 2019-02-19: 50 mg via INTRAVENOUS

## 2019-02-19 MED ORDER — LACTATED RINGERS IV SOLN
INTRAVENOUS | Status: DC
  Administered 2019-02-19 (×2): via INTRAVENOUS

## 2019-02-19 MED ORDER — GABAPENTIN 300 MG PO CAPS
300.0000 mg | ORAL_CAPSULE | Freq: Every day | ORAL | Status: DC
  Administered 2019-02-19: 300 mg via ORAL
  Filled 2019-02-19: qty 1

## 2019-02-19 MED ORDER — ROCURONIUM BROMIDE 10 MG/ML (PF) SYRINGE
PREFILLED_SYRINGE | INTRAVENOUS | Status: AC
  Filled 2019-02-19: qty 10

## 2019-02-19 MED ORDER — BUPIVACAINE LIPOSOME 1.3 % IJ SUSP
INTRAMUSCULAR | Status: AC
  Filled 2019-02-19: qty 20

## 2019-02-19 MED ORDER — SENNOSIDES-DOCUSATE SODIUM 8.6-50 MG PO TABS: 1.0000 | ORAL_TABLET | Freq: Every evening | ORAL | Status: DC | PRN

## 2019-02-19 MED ORDER — MIDAZOLAM HCL 2 MG/2ML IJ SOLN: 0.5000 mg | Freq: Once | INTRAMUSCULAR | Status: DC | PRN

## 2019-02-19 MED ORDER — CEFAZOLIN SODIUM-DEXTROSE 1-4 GM/50ML-% IV SOLN
INTRAVENOUS | Status: AC
  Filled 2019-02-19: qty 50

## 2019-02-19 MED ORDER — FENTANYL CITRATE (PF) 250 MCG/5ML IJ SOLN
INTRAMUSCULAR | Status: AC
  Filled 2019-02-19: qty 5

## 2019-02-19 MED ORDER — FENTANYL CITRATE (PF) 100 MCG/2ML IJ SOLN
INTRAMUSCULAR | Status: DC | PRN
  Administered 2019-02-19 (×10): 50 ug via INTRAVENOUS

## 2019-02-19 MED ORDER — ONDANSETRON HCL 4 MG PO TABS: 8.0000 mg | ORAL_TABLET | Freq: Four times a day (QID) | ORAL | Status: DC | PRN

## 2019-02-19 MED ORDER — SUCCINYLCHOLINE CHLORIDE 200 MG/10ML IV SOSY
PREFILLED_SYRINGE | INTRAVENOUS | Status: AC
  Filled 2019-02-19: qty 10

## 2019-02-19 MED ORDER — LIDOCAINE HCL (CARDIAC) PF 50 MG/5ML IV SOSY
PREFILLED_SYRINGE | INTRAVENOUS | Status: DC | PRN
  Administered 2019-02-19: 50 mg via INTRAVENOUS

## 2019-02-19 MED ORDER — SODIUM CHLORIDE 0.9 % IV SOLN
INTRAVENOUS | Status: DC | PRN
  Administered 2019-02-19: 60 mL

## 2019-02-19 MED ORDER — ZOLPIDEM TARTRATE 5 MG PO TABS: 10.0000 mg | ORAL_TABLET | Freq: Every evening | ORAL | Status: DC | PRN

## 2019-02-19 MED ORDER — HYDROCODONE-ACETAMINOPHEN 7.5-325 MG PO TABS: 1.0000 | ORAL_TABLET | Freq: Once | ORAL | Status: DC | PRN

## 2019-02-19 MED ORDER — MONTELUKAST SODIUM 10 MG PO TABS
10.0000 mg | ORAL_TABLET | Freq: Every day | ORAL | Status: DC
  Administered 2019-02-19: 10 mg via ORAL
  Filled 2019-02-19: qty 1

## 2019-02-19 MED ORDER — LORATADINE 10 MG PO TABS
10.0000 mg | ORAL_TABLET | Freq: Every day | ORAL | Status: DC
  Administered 2019-02-19 – 2019-02-20 (×2): 10 mg via ORAL
  Filled 2019-02-19 (×2): qty 1

## 2019-02-19 MED ORDER — ZOLPIDEM TARTRATE 5 MG PO TABS
5.0000 mg | ORAL_TABLET | Freq: Every evening | ORAL | Status: DC | PRN
  Administered 2019-02-20: 5 mg via ORAL
  Filled 2019-02-19: qty 1

## 2019-02-19 MED ORDER — HYDROCHLOROTHIAZIDE 25 MG PO TABS
25.0000 mg | ORAL_TABLET | Freq: Every day | ORAL | Status: DC
  Administered 2019-02-19 – 2019-02-20 (×2): 25 mg via ORAL
  Filled 2019-02-19 (×2): qty 1

## 2019-02-19 MED ORDER — LISDEXAMFETAMINE DIMESYLATE 50 MG PO CAPS
70.0000 mg | ORAL_CAPSULE | Freq: Every day | ORAL | Status: DC
  Administered 2019-02-20: 10:00:00 70 mg via ORAL
  Filled 2019-02-19 (×2): qty 1

## 2019-02-19 MED ORDER — FENTANYL CITRATE (PF) 100 MCG/2ML IJ SOLN
50.0000 ug | INTRAMUSCULAR | Status: DC | PRN
  Administered 2019-02-19 – 2019-02-20 (×4): 100 ug via INTRAVENOUS
  Filled 2019-02-19 (×4): qty 2

## 2019-02-19 MED ORDER — PROMETHAZINE HCL 25 MG/ML IJ SOLN: 6.2500 mg | INTRAMUSCULAR | Status: DC | PRN

## 2019-02-19 MED ORDER — KETOROLAC TROMETHAMINE 30 MG/ML IJ SOLN
30.0000 mg | Freq: Once | INTRAMUSCULAR | Status: AC
  Administered 2019-02-19: 30 mg via INTRAVENOUS

## 2019-02-19 MED ORDER — MIDAZOLAM HCL 2 MG/2ML IJ SOLN
INTRAMUSCULAR | Status: AC
  Filled 2019-02-19: qty 2

## 2019-02-19 MED ORDER — ENOXAPARIN SODIUM 80 MG/0.8ML ~~LOC~~ SOLN
70.0000 mg | SUBCUTANEOUS | Status: DC
  Administered 2019-02-20: 70 mg via SUBCUTANEOUS
  Filled 2019-02-19: qty 0.8

## 2019-02-19 MED ORDER — MENTHOL 3 MG MT LOZG
1.0000 | LOZENGE | OROMUCOSAL | Status: DC | PRN
  Administered 2019-02-19: 15:00:00 3 mg via ORAL
  Filled 2019-02-19: qty 9

## 2019-02-19 MED ORDER — DIPHENHYDRAMINE HCL 50 MG/ML IJ SOLN: 25.0000 mg | Freq: Four times a day (QID) | INTRAMUSCULAR | Status: DC | PRN

## 2019-02-19 MED ORDER — BUPIVACAINE LIPOSOME 1.3 % IJ SUSP
20.0000 mL | Freq: Once | INTRAMUSCULAR | Status: DC
  Filled 2019-02-19: qty 20

## 2019-02-19 MED ORDER — THYROID 30 MG PO TABS
60.0000 mg | ORAL_TABLET | Freq: Every day | ORAL | Status: DC
  Administered 2019-02-20: 08:00:00 60 mg via ORAL
  Filled 2019-02-19 (×2): qty 2

## 2019-02-19 MED ORDER — MIDAZOLAM HCL 5 MG/5ML IJ SOLN
INTRAMUSCULAR | Status: DC | PRN
  Administered 2019-02-19: 2 mg via INTRAVENOUS

## 2019-02-19 MED ORDER — SODIUM CHLORIDE 0.9 % IV SOLN: 8.0000 mg | Freq: Four times a day (QID) | INTRAVENOUS | Status: DC | PRN

## 2019-02-19 MED ORDER — KCL IN DEXTROSE-NACL 40-5-0.45 MEQ/L-%-% IV SOLN
INTRAVENOUS | Status: AC
  Administered 2019-02-19 (×2): via INTRAVENOUS

## 2019-02-19 MED ORDER — PROMETHAZINE HCL 25 MG/ML IJ SOLN: 25.0000 mg | Freq: Four times a day (QID) | INTRAMUSCULAR | Status: DC | PRN

## 2019-02-19 MED ORDER — ONDANSETRON HCL 4 MG/2ML IJ SOLN
INTRAMUSCULAR | Status: AC
  Filled 2019-02-19: qty 2

## 2019-02-19 MED ORDER — 0.9 % SODIUM CHLORIDE (POUR BTL) OPTIME
TOPICAL | Status: DC | PRN
  Administered 2019-02-19: 08:00:00 2000 mL

## 2019-02-19 MED ORDER — DEXTROSE 5 % IV SOLN
3.0000 g | INTRAVENOUS | Status: AC
  Administered 2019-02-19: 08:00:00 3 g via INTRAVENOUS
  Filled 2019-02-19: qty 3000

## 2019-02-19 MED ORDER — SUGAMMADEX SODIUM 200 MG/2ML IV SOLN
INTRAVENOUS | Status: DC | PRN
  Administered 2019-02-19: 562.4 mg via INTRAVENOUS

## 2019-02-19 MED ORDER — BISACODYL 10 MG RE SUPP: 10.0000 mg | Freq: Every day | RECTAL | Status: DC | PRN

## 2019-02-19 MED ORDER — ENOXAPARIN SODIUM 40 MG/0.4ML ~~LOC~~ SOLN: 40.0000 mg | SUBCUTANEOUS | Status: DC

## 2019-02-19 MED ORDER — MELOXICAM 7.5 MG PO TABS: 15.0000 mg | ORAL_TABLET | Freq: Every day | ORAL | Status: DC

## 2019-02-19 MED ORDER — PROPOFOL 10 MG/ML IV BOLUS
INTRAVENOUS | Status: DC | PRN
  Administered 2019-02-19: 180 mg via INTRAVENOUS
  Administered 2019-02-19: 20 mg via INTRAVENOUS

## 2019-02-19 MED ORDER — KETOROLAC TROMETHAMINE 30 MG/ML IJ SOLN
INTRAMUSCULAR | Status: AC
  Filled 2019-02-19: qty 1

## 2019-02-19 MED ORDER — IBUPROFEN 800 MG PO TABS: 800.0000 mg | ORAL_TABLET | Freq: Four times a day (QID) | ORAL | Status: DC

## 2019-02-19 MED ORDER — ALUM & MAG HYDROXIDE-SIMETH 200-200-20 MG/5ML PO SUSP: 30.0000 mL | ORAL | Status: DC | PRN

## 2019-02-19 MED ORDER — DULOXETINE HCL 60 MG PO CPEP
120.0000 mg | ORAL_CAPSULE | Freq: Every day | ORAL | Status: DC
  Administered 2019-02-19: 23:00:00 120 mg via ORAL
  Filled 2019-02-19: qty 2

## 2019-02-19 MED ORDER — HEMOSTATIC AGENTS (NO CHARGE) OPTIME
TOPICAL | Status: DC | PRN
  Administered 2019-02-19: 1 via TOPICAL

## 2019-02-19 MED ORDER — ALBUTEROL SULFATE HFA 108 (90 BASE) MCG/ACT IN AERS: 2.0000 | INHALATION_SPRAY | Freq: Four times a day (QID) | RESPIRATORY_TRACT | Status: DC | PRN

## 2019-02-19 MED ORDER — MECLIZINE HCL 12.5 MG PO TABS: 25.0000 mg | ORAL_TABLET | Freq: Three times a day (TID) | ORAL | Status: DC | PRN

## 2019-02-19 MED ORDER — ALBUTEROL SULFATE (2.5 MG/3ML) 0.083% IN NEBU: 2.5000 mg | INHALATION_SOLUTION | Freq: Four times a day (QID) | RESPIRATORY_TRACT | Status: DC | PRN

## 2019-02-19 MED ORDER — DOCUSATE SODIUM 100 MG PO CAPS
100.0000 mg | ORAL_CAPSULE | Freq: Two times a day (BID) | ORAL | Status: DC
  Administered 2019-02-19 – 2019-02-20 (×3): 100 mg via ORAL
  Filled 2019-02-19 (×3): qty 1

## 2019-02-19 MED ORDER — HYDROMORPHONE HCL 1 MG/ML IJ SOLN
0.2500 mg | INTRAMUSCULAR | Status: DC | PRN
  Administered 2019-02-19 (×4): 0.5 mg via INTRAVENOUS
  Filled 2019-02-19 (×4): qty 0.5

## 2019-02-19 MED ORDER — SODIUM CHLORIDE (PF) 0.9 % IJ SOLN
INTRAMUSCULAR | Status: AC
  Filled 2019-02-19: qty 40

## 2019-02-19 MED ORDER — ONDANSETRON HCL 4 MG/2ML IJ SOLN
INTRAMUSCULAR | Status: DC | PRN
  Administered 2019-02-19: 4 mg via INTRAVENOUS

## 2019-02-19 MED ORDER — OXYCODONE HCL 5 MG PO TABS
5.0000 mg | ORAL_TABLET | ORAL | Status: DC | PRN
  Administered 2019-02-19 – 2019-02-20 (×4): 10 mg via ORAL
  Filled 2019-02-19 (×4): qty 2

## 2019-02-19 MED ORDER — SUCCINYLCHOLINE 20MG/ML (10ML) SYRINGE FOR MEDFUSION PUMP - OPTIME
INTRAMUSCULAR | Status: DC | PRN
  Administered 2019-02-19: 150 mg via INTRAVENOUS

## 2019-02-19 MED ORDER — THYROID 60 MG PO TABS
60.0000 mg | ORAL_TABLET | Freq: Every day | ORAL | Status: DC
  Filled 2019-02-19 (×2): qty 1

## 2019-02-19 SURGICAL SUPPLY — 44 items
ADH SKN CLS APL DERMABOND .7 (GAUZE/BANDAGES/DRESSINGS) ×2
BLADE SURG SZ10 CARB STEEL (BLADE) ×4 IMPLANT
CLOTH BEACON ORANGE TIMEOUT ST (SAFETY) ×4 IMPLANT
COVER LIGHT HANDLE STERIS (MISCELLANEOUS) ×8 IMPLANT
COVER WAND RF STERILE (DRAPES) ×4 IMPLANT
DERMABOND ADVANCED (GAUZE/BANDAGES/DRESSINGS) ×2
DERMABOND ADVANCED .7 DNX12 (GAUZE/BANDAGES/DRESSINGS) ×2 IMPLANT
DRAPE WARM FLUID 44X44 (DRAPES) ×4 IMPLANT
DRSG OPSITE POSTOP 4X10 (GAUZE/BANDAGES/DRESSINGS) ×2 IMPLANT
ELECT REM PT RETURN 9FT ADLT (ELECTROSURGICAL) ×4
ELECTRODE REM PT RTRN 9FT ADLT (ELECTROSURGICAL) ×2 IMPLANT
GAUZE 4X4 16PLY RFD (DISPOSABLE) ×4 IMPLANT
GLOVE BIOGEL PI IND STRL 7.0 (GLOVE) ×8 IMPLANT
GLOVE BIOGEL PI IND STRL 8 (GLOVE) ×2 IMPLANT
GLOVE BIOGEL PI INDICATOR 7.0 (GLOVE) ×10
GLOVE BIOGEL PI INDICATOR 8 (GLOVE) ×2
GLOVE ECLIPSE 6.5 STRL STRAW (GLOVE) ×4 IMPLANT
GLOVE ECLIPSE 8.0 STRL XLNG CF (GLOVE) ×4 IMPLANT
GOWN STRL REUS W/TWL LRG LVL3 (GOWN DISPOSABLE) ×8 IMPLANT
GOWN STRL REUS W/TWL XL LVL3 (GOWN DISPOSABLE) ×4 IMPLANT
HEMOSTAT ARISTA ABSORB 3G PWDR (HEMOSTASIS) ×2 IMPLANT
INST SET MAJOR GENERAL (KITS) ×4 IMPLANT
KIT TURNOVER KIT A (KITS) ×4 IMPLANT
MANIFOLD NEPTUNE II (INSTRUMENTS) ×4 IMPLANT
NDL HYPO 21X1.5 SAFETY (NEEDLE) ×2 IMPLANT
NEEDLE HYPO 21X1.5 SAFETY (NEEDLE) ×4 IMPLANT
NS IRRIG 1000ML POUR BTL (IV SOLUTION) ×8 IMPLANT
PACK ABDOMINAL MAJOR (CUSTOM PROCEDURE TRAY) ×4 IMPLANT
PAD ARMBOARD 7.5X6 YLW CONV (MISCELLANEOUS) ×4 IMPLANT
RETRACTOR WND ALEXIS 25 LRG (MISCELLANEOUS) IMPLANT
RTRCTR WOUND ALEXIS 25CM LRG (MISCELLANEOUS) ×4
SET BASIN LINEN APH (SET/KITS/TRAYS/PACK) ×4 IMPLANT
SPONGE LAP 18X18 RF (DISPOSABLE) ×8 IMPLANT
SUT CHROMIC 0 CT 1 (SUTURE) ×8 IMPLANT
SUT MON AB 3-0 SH 27 (SUTURE) ×8 IMPLANT
SUT PLAIN 2 0 XLH (SUTURE) ×2 IMPLANT
SUT VIC AB 0 CT1 27 (SUTURE) ×12
SUT VIC AB 0 CT1 27XCR 8 STRN (SUTURE) ×6 IMPLANT
SUT VIC AB 0 CTX 36 (SUTURE) ×4
SUT VIC AB 0 CTX36XBRD ANTBCTR (SUTURE) ×2 IMPLANT
SUT VICRYL 3 0 (SUTURE) ×4 IMPLANT
SYR 20CC LL (SYRINGE) ×4 IMPLANT
TOWEL SURG RFD BLUE STRL DISP (DISPOSABLE) ×4 IMPLANT
TRAY FOLEY MTR SLVR 16FR STAT (SET/KITS/TRAYS/PACK) ×4 IMPLANT

## 2019-02-19 NOTE — Transfer of Care (Signed)
Immediate Anesthesia Transfer of Care Note  Patient: Krista [name]  Procedure(s) Performed: HYSTERECTOMY SUPRACERVICAL ABDOMINAL (N/A Abdomen) OPEN BILATERAL SALPINGECTOMY (Bilateral Abdomen)  Patient Location: PACU  Anesthesia Type:General  Level of Consciousness: awake and alert   Airway & Oxygen Therapy: Patient Spontanous Breathing and Patient connected to nasal cannula oxygen  Post-op Assessment: Report given to RN and Post -op Vital signs reviewed and stable  Post vital signs: Reviewed and stable  Last Vitals:  Vitals Value Taken Time  BP 163/74 02/19/19 0933  Temp    Pulse 85 02/19/19 0939  Resp 18 02/19/19 0939  SpO2 100 % 02/19/19 0939  Vitals shown include unvalidated device data.  Last Pain:  Vitals:   02/19/19 0724  TempSrc: Oral  PainSc: 0-No pain      Patients Stated Pain Goal: 7 ([id number])  Complications: Patient re-intubated

## 2019-02-19 NOTE — Progress Notes (Signed)
Ambulated to bathroom and voided. SCD's applied

## 2019-02-19 NOTE — H&P (Signed)
Preoperative History and Physical  Krista [name] is a 43 y.o. [id number] with No LMP recorded. Patient has had an injection. admitted for a abdominal supracervical hysterectomy with bilateral salpingectomy for management of menorrhagia dysmenorrhea related to fibroids..  Has used depo provera for years to control menses but wants to get off of depoo for weight and bones and opts for hysterectomy over ablation which given the size of her fibroids is her best option. Just got her last depo provera so will schedule for 02/19/2019  PMH:      Past Medical History:  Diagnosis Date  . Allergy   . Anxiety   . Asthma    childhood  . Depression   . Diabetes mellitus without complication ([place])   . Fibroids 02/13/2014  . Friable cervix 03/15/2015  . Hypertension   . Hypothyroid   . Irregular bleeding 03/15/2015  . Lyme disease   . Menometrorrhagia 02/11/2014  . Obesity   . Rosacea   . Severe menstrual cramps 09/17/2015  . Superficial fungus infection of skin 10/20/2015  . Weight gain 08/31/2015  PSH:       Past Surgical History:  Procedure Laterality Date  . CESAREAN SECTION    . CHOLECYSTECTOMY    . ingrown toe nail removal     . ovarian cyst removal     . TONSILLECTOMY    . WISDOM TOOTH EXTRACTION    POb/GynH:          OB History     Gravida Para Term Preterm AB Living   5 3   2 3     SAB TAB Ectopic Multiple Live Births    2    3      SH:  Social History        Tobacco Use  . Smoking status: Never Smoker  . Smokeless tobacco: Never Used  Substance Use Topics  . Alcohol use: Yes    Alcohol/week: 0.0 standard drinks    Comment: rarely  . Drug use: No  FH:       Family History  Problem Relation Age of Onset  . Diabetes Mother   . Hypertension Mother   . Heart attack Father   . Hypertension Father   . Depression Sister   . Diabetes Maternal Grandmother   . Coronary artery disease Maternal Grandmother   . Diabetes Maternal Grandfather   . Coronary artery disease  Maternal Grandfather   . Coronary artery disease Paternal Grandmother   . Diabetes Paternal Grandmother   . Coronary artery disease Paternal Grandfather   Allergies:       Allergies  Allergen Reactions  . Propofol Rash    Also had itching  Medications:  Current Outpatient Medications:  . albuterol (PROAIR HFA) 108 (90 Base) MCG/ACT inhaler, ProAir HFA 90 mcg/actuation aerosol inhaler TAKE 2 PUFFS BY MOUTH EVERY 6 HOURS AS NEEDED FOR WHEEZE, Disp: , Rfl:  . ALPRAZolam (XANAX) 0.5 MG tablet, TAKE 1 TABLET BY MOUTH THREE TIMES A DAY AS NEEDED FOR ANXIETY, Disp: 60 tablet, Rfl: 3  . DULoxetine (CYMBALTA) 60 MG capsule, Take 120 mg by mouth at bedtime., Disp: , Rfl:  . fexofenadine-pseudoephedrine (ALLEGRA-D 24) 180-240 MG per 24 hr tablet, Take 1 tablet by mouth as needed. , Disp: , Rfl:  . gabapentin (NEURONTIN) 100 MG capsule, Take 100 mg by mouth as needed. , Disp: , Rfl:  . hydrOXYzine (ATARAX/VISTARIL) 10 MG tablet, Take 1 tablet (10 mg total) by mouth 3 (three) times daily as needed.,  Disp: 30 tablet, Rfl: 0  . lisinopril (PRINIVIL,ZESTRIL) 10 MG tablet, Take 1 tablet (10 mg total) by mouth daily. (Patient taking differently: Take 20 mg by mouth daily. ), Disp: 90 tablet, Rfl: 3  . meclizine (ANTIVERT) 25 MG tablet, 25 mg as needed. , Disp: , Rfl:  . megestrol (MEGACE) 40 MG tablet, TAKE 1 TABLET BY MOUTH EVERY DAY, Disp: 90 tablet, Rfl: 3  . Melatonin 5 MG TABS, Take by mouth as needed. , Disp: , Rfl:  . meloxicam (MOBIC) 15 MG tablet, TAKE 1 TABLET BY MOUTH EVERY DAY, Disp: , Rfl:  . metFORMIN (GLUCOPHAGE) 500 MG tablet, Take 1 tablet (500 mg total) by mouth 2 (two) times daily., Disp: 180 tablet, Rfl: 3  . montelukast (SINGULAIR) 10 MG tablet, Take by mouth., Disp: , Rfl:  . oxymetazoline (AFRIN) 0.05 % nasal spray, Place into the nose., Disp: , Rfl:  . hydrochlorothiazide (HYDRODIURIL) 25 MG tablet, Take 1 tablet (25 mg total) by mouth daily., Disp: 90 tablet, Rfl: 2  .  lisdexamfetamine (VYVANSE) 70 MG capsule, Take by mouth daily. , Disp: , Rfl:  . naproxen sodium (ANAPROX) 550 MG tablet, TAKE ONE TABLET BY MOUTH EVERY 8-12 HOURS AS NEEDED CRAMPS, Disp: 30 tablet, Rfl: 2  . phenazopyridine (PYRIDIUM) 200 MG tablet, Take 1 tablet (200 mg total) by mouth 3 (three) times daily as needed for pain. (Patient not taking: Reported on 01/21/2019), Disp: 10 tablet, Rfl: 0  Review of Systems:  Review of Systems  Constitutional: Negative for fever, chills, weight loss, malaise/fatigue and diaphoresis.  HENT: Negative for hearing loss, ear pain, nosebleeds, congestion, sore throat, neck pain, tinnitus and ear discharge.  Eyes: Negative for blurred vision, double vision, photophobia, pain, discharge and redness.  Respiratory: Negative for cough, hemoptysis, sputum production, shortness of breath, wheezing and stridor.  Cardiovascular: Negative for chest pain, palpitations, orthopnea, claudication, leg swelling and PND.  Gastrointestinal: Positive for abdominal pain. Negative for heartburn, nausea, vomiting, diarrhea, constipation, blood in stool and melena.  Genitourinary: Negative for dysuria, urgency, frequency, hematuria and flank pain.  Musculoskeletal: Negative for myalgias, back pain, joint pain and falls.  Skin: Negative for itching and rash.  Neurological: Negative for dizziness, tingling, tremors, sensory change, speech change, focal weakness, seizures, loss of consciousness, weakness and headaches.  Endo/Heme/Allergies: Negative for environmental allergies and polydipsia. Does not bruise/bleed easily.  Psychiatric/Behavioral: Negative for depression, suicidal ideas, hallucinations, memory loss and substance abuse. The patient is not nervous/anxious and does not have insomnia.  PHYSICAL EXAM:  Blood pressure 110/80, pulse 99, height 5' 9.2" (1.758 m), weight (!) 311 lb (141.1 kg).  Vitals reviewed.  Constitutional: She is oriented to person, place, and time. She  appears well-developed and well-nourished.  HENT:  Head: Normocephalic and atraumatic.  Right Ear: External ear normal.  Left Ear: External ear normal.  Nose: Nose normal.  Mouth/Throat: Oropharynx is clear and moist.  Eyes: Conjunctivae and EOM are normal. Pupils are equal, round, and reactive to light. Right eye exhibits no discharge. Left eye exhibits no discharge. No scleral icterus.  Neck: Normal range of motion. Neck supple. No tracheal deviation present. No thyromegaly present.  Cardiovascular: Normal rate, regular rhythm, normal heart sounds and intact distal pulses. Exam reveals no gallop and no friction rub.  No murmur heard.  Respiratory: Effort normal and breath sounds normal. No respiratory distress. She has no wheezes. She has no rales. She exhibits no tenderness.  GI: Soft. Bowel sounds are normal. She exhibits no distension and  no mass. There is tenderness. There is no rebound and no guarding.  Genitourinary:  Vulva is normal without lesions Vagina is pink moist without discharge Cervix normal in appearance and pap is normal Uterus is 12 weeks size 3 fibroids Adnexa is negative with normal sized ovaries by sonogram  Musculoskeletal: Normal range of motion. She exhibits no edema and no tenderness.  Neurological: She is alert and oriented to person, place, and time. She has normal reflexes. She displays normal reflexes. No cranial nerve deficit. She exhibits normal muscle tone. Coordination normal.  Skin: Skin is warm and dry. No rash noted. No erythema. No pallor.  Psychiatric: She has a normal mood and affect. Her behavior is normal. Judgment and thought content normal.  Labs:  No results found for this or any previous visit (from the past 336 hour(s)).  EKG:     Orders placed or performed during the hospital encounter of 08/17/17  . EKG 12-Lead  . EKG 12-Lead  . ED EKG within 10 minutes  . ED EKG within 10 minutes  . EKG   Results for orders placed or performed  during the hospital encounter of 02/19/19 (from the past 168 hour(s))  Glucose, capillary   Collection Time: 02/19/19  7:10 AM  Result Value Ref Range   Glucose-Capillary 164 ([name]) 70 - 99 mg/dL  Results for orders placed or performed during the hospital encounter of 02/17/19 (from the past 168 hour(s))  CBC   Collection Time: 02/17/19  9:25 AM  Result Value Ref Range   WBC 7.7 4.0 - 10.5 K/uL   RBC 4.83 3.87 - 5.11 MIL/uL   Hemoglobin 12.9 12.0 - 15.0 g/dL   HCT 41.4 36.0 - 46.0 %   MCV 85.7 80.0 - 100.0 fL   MCH 26.7 26.0 - 34.0 pg   MCHC 31.2 30.0 - 36.0 g/dL   RDW 13.6 11.5 - 15.5 %   Platelets 246 150 - 400 K/uL   nRBC 0.0 0.0 - 0.2 %  Comprehensive metabolic panel   Collection Time: 02/17/19  9:25 AM  Result Value Ref Range   Sodium 139 135 - 145 mmol/L   Potassium 3.5 3.5 - 5.1 mmol/L   Chloride 107 98 - 111 mmol/L   CO2 21 (L) 22 - 32 mmol/L   Glucose, Bld 241 ([name]) 70 - 99 mg/dL   BUN 18 6 - 20 mg/dL   Creatinine, Ser 0.95 0.44 - 1.00 mg/dL   Calcium 9.0 8.9 - 10.3 mg/dL   Total Protein 7.0 6.5 - 8.1 g/dL   Albumin 4.1 3.5 - 5.0 g/dL   AST 16 15 - 41 U/L   ALT 27 0 - 44 U/L   Alkaline Phosphatase 49 38 - 126 U/L   Total Bilirubin 0.5 0.3 - 1.2 mg/dL   GFR calc non Af Amer >60 >60 mL/min   GFR calc Af Amer >60 >60 mL/min   Anion gap 11 5 - 15  hCG, quantitative, pregnancy   Collection Time: 02/17/19  9:25 AM  Result Value Ref Range   hCG, Beta Chain, Quant, S <1 <5 mIU/mL  Rapid HIV screen (HIV 1/2 Ab+Ag)   Collection Time: 02/17/19  9:25 AM  Result Value Ref Range   HIV-1 P24 Antigen - HIV24 NON REACTIVE NON REACTIVE   HIV 1/2 Antibodies NON REACTIVE NON REACTIVE   Interpretation (HIV Ag Ab)      A non reactive test result means that HIV 1 or HIV 2 antibodies and HIV 1 p24  antigen were not detected in the specimen.  Type and screen   Collection Time: 02/17/19  9:25 AM  Result Value Ref Range   ABO/RH(D) A POS    Antibody Screen NEG    Sample Expiration  03/03/2019,2359    Extend sample reason      NO TRANSFUSIONS OR PREGNANCY IN THE PAST 3 MONTHS Performed at [facility], [address] [place], [place] [address]   Urinalysis, Routine w reflex microscopic   Collection Time: 02/17/19  9:30 AM  Result Value Ref Range   Color, Urine YELLOW YELLOW   APPearance HAZY (A) CLEAR   Specific Gravity, Urine 1.025 1.005 - 1.030   pH 5.0 5.0 - 8.0   Glucose, UA >=500 (A) NEGATIVE mg/dL   Hgb urine dipstick NEGATIVE NEGATIVE   Bilirubin Urine NEGATIVE NEGATIVE   Ketones, ur NEGATIVE NEGATIVE mg/dL   Protein, ur 30 (A) NEGATIVE mg/dL   Nitrite NEGATIVE NEGATIVE   Leukocytes,Ua MODERATE (A) NEGATIVE   RBC / HPF 0-5 0 - 5 RBC/hpf   WBC, UA 11-20 0 - 5 WBC/hpf   Bacteria, UA RARE (A) NONE SEEN   Squamous Epithelial / LPF 0-5 0 - 5   Mucus PRESENT    Uric Acid Crys, UA PRESENT   Hemoglobin A1c   Collection Time: 02/17/19  9:34 AM  Result Value Ref Range   Hgb A1c MFr Bld 7.5 ([name]) 4.8 - 5.6 %   Mean Plasma Glucose 168.55 mg/dL  Glucose, capillary   Collection Time: 02/17/19  9:40 AM  Result Value Ref Range   Glucose-Capillary 237 ([name]) 70 - 99 mg/dL  Results for orders placed or performed during the hospital encounter of 02/17/19 (from the past 168 hour(s))  SARS CORONAVIRUS 2 (TAT 6-24 HRS) Nasal Swab Aptima Multi Swab   Collection Time: 02/17/19  7:19 AM   Specimen: Aptima Multi Swab; Nasal Swab  Result Value Ref Range   SARS Coronavirus 2 NEGATIVE NEGATIVE        Imaging Studies:  GYNECOLOGIC SONOGRAM  Krista [name] is a 43 y.o. [id number] LMP 10/25/2015 for a pelvic sonogram for menometrorrhagia.  Uterus 10.6 x 6.2 x 6.4 cm, Heterogenous anteverted uterus w/mult fibroids  Endometrium 11.9 mm, symmetrical, wnl  Right ovary 2.9 x 2.2 x 1.6 cm, wnl  Left ovary 2.2 x 1.5 x 2.1 cm, wnl  No free fluid,no pain during ultrasound  fibroids  (#1) calcified post rt intramural fibroid 2 x 1.4 x 1.1cm, (#2) calcified intramural fibroid  post lt .5 x .4 x .4 cm,(#3) pedunculated rt body 2.5 x 2.9 x 2.1cm  Technician Comments:  US PELVIC US TA/TV: Heterogenous anteverted uterus w/mult fibroids,(#1) calcified post rt intramural fibroid 2 x 1.4 x 1.1cm, (#2) calcified intramural fibroid post lt .5 x .4 x .4 cm,(#3) pedunculated rt body 2.5 x 2.9 x 2.1cm, EEC 11.9 mm,normal rt ov,limited view of left ov  [employer]  12/02/2015  10:16 AM  Clinical Impression and recommendations:  I have reviewed the sonogram results above, combined with the patient's current clinical course, below are my impressions and any appropriate recommendations for management based on the sonographic findings.  Multiple very small fibroids probably not contributing to the patient's bleeding  Otherwise normal sonogram  Krista [name]  12/02/2015  10:43 AM  Assessment:    ICD-10-CM   1. Fibroids  D21.9   2. Dysmenorrhea  N94.6   3. Menorrhagia with regular cycle  N92.0   Plan:  Supracervical abdominal hysterectomy  with removal of tubes, preservation of the ovaries 02/19/2019  Krista [name]  01/21/2019  1:02 PM

## 2019-02-19 NOTE — Op Note (Signed)
Preoperative diagnosis:  1.  fibroids                                          2.  dysmenorrhea                                         3.  menorrhagia                                           Postoperative diagnosis:  Same as above   Procedure:  Abdominal hysterectomy, supracervical, with removal of Fallopian tubes  Surgeon:  Florian Buff  Assistant:    Anesthesia:  General endotracheal  Preoperative clinical summary:   Krista [name] is a 43 y.o. [id number] with No LMP recorded. Patient has had an injection. admitted for a abdominal supracervical hysterectomy with bilateral salpingectomy for management of menorrhagia dysmenorrhea related to fibroids..  Has used depo provera for years to control menses but wants to get off of depoo for weight and bones and opts for hysterectomy over ablation which given the size of her fibroids is her best option. Just got her last depo provera so will schedule for 02/19/2019  PMH:   Intraoperative findings: uterus enlarged with fibroids, bladder adherent to loqwer uterine segment, both ovaries normal, tubes normal  Description of operation:  Patient was taken to the operating room and placed in the supine position where she underwent general endotracheal anesthesia.  She was then prepped and draped in the usual sterile fashion and a Foley catheter was placed for continuous bladder drainage.  A Pfannenstiel skin incision was made and carried down sharply to the rectus fascia which was scored in the midline and extended laterally.  The fascia was taken off the muscles superiorly and inferiorly without difficulty.  The muscles were divided.  The peritoneal cavity was entered.  An large Alexis self-retaining retractor was placed.  The upper abdomen was packed away. Both uterine cornu were grasped with Coker clamps.  The left round ligament was suture ligated and coagulated with the electrocautery unit.  The left vesicouterine serosal flap was created.  An  avascular window in in the peritoneum was created and the utero-ovarian ligament was cross clamped, cut and suture ligated.  The right round ligament was suture ligated and cut with the electrocautery unit.  The vesicouterine serosal flap on the right was created.  An avascular window in the peritoneum was created and the right utero-ovarian ligament was cross clamped, cut and double suture ligated.  Thus both ovaries were preserved.  The uterine vessels were skeletonized bilaterally.  The uterine vessels were clamped bilaterally,  then cut and suture ligated.  Two more pedicles were taken down the cervix medial to the uterine vessels.  Each pedicle was clamped cut and suture ligated with good resulting hemostasis.  As per the preoperative plan the cervix was then transected sharply and the specimen was removed.  The cervical stump was then closed anterior to posterior for hemostasis and reduce postoperative adhesions.  The pelvis was irrigated vigorously and all pedicles were examined and found to be hemostatic.  Both Fallpoian tubes were removed by cross clamping with a Kelley clamp and  a fore and aft 3-0 monocryl suture for hemostasis.  All specimens were sent to pathology for routine evaluation.  The Alexis self-retaining retractor was removed and the pelvis was irrigated vigorously. Arista was placed for additional hemostatisis as well.  All packs were removed and all counts were correct at this point x 3.  The muscles and peritoneum were reapproximated loosely.  The fascia was closed with 0 Vicryl running.  The subcutaneous tissue was reapproximated using 2-0 plain gut.  The skin was closed using 3-0 Vicryl on a Keith needle in a subcuticular fashion.  Dermabond was then applied for additional wound integrity and to serve as a postoperative bacterial barrier. Exparel 266 mg in 60 cc total volume was injected in the subcutaneous tissue for post operative pain relief.   The patient was awakened from anesthesia  taken to the recovery room in good stable condition. All sponge instrument and needle counts were correct x 3.  The patient received 3 grams of Ancef and 30 mg of  Toradol prophylactically preoperatively.  Estimated blood loss for the procedure was 100  cc.  Florian Buff, MD  02/19/2019 9:21 AM

## 2019-02-19 NOTE — Anesthesia Procedure Notes (Signed)
Procedure Name: Intubation Date/Time: 02/19/2019 7:44 AM Performed by: Ollen Bowl, CRNA Pre-anesthesia Checklist: Patient identified, Patient being monitored, Timeout performed, Emergency Drugs available and Suction available Patient Re-evaluated:Patient Re-evaluated prior to induction Oxygen Delivery Method: Circle system utilized Preoxygenation: Pre-oxygenation with 100% oxygen Induction Type: IV induction Ventilation: Mask ventilation without difficulty Laryngoscope Size: Mac and 3 Grade View: Grade II Tube type: Oral Tube size: 7.0 mm Number of attempts: 1 Airway Equipment and Method: Stylet Placement Confirmation: ETT inserted through vocal cords under direct vision,  positive ETCO2 and breath sounds checked- equal and bilateral Secured at: 21 cm Tube secured with: Tape Dental Injury: Teeth and Oropharynx as per pre-operative assessment  Comments: Could visualize base of Rt Posterior arytenoid, would opt for glidescope in future.

## 2019-02-19 NOTE — Progress Notes (Signed)
Foley removed.  Ambulated down to end of hall, back to nurse's station and around the block and back to room.  Ambulated to bathroom and now sitting up in chair eating supper.  Abd dressing continues to be dry and intact.  Receiving scheduled tylenol as well as prn pain meds.  Is hot natured and has been sweating.

## 2019-02-19 NOTE — Anesthesia Preprocedure Evaluation (Signed)
Anesthesia Evaluation  Patient identified by MRN, date of birth, ID band Patient awake  General Assessment Comment:REPORTS ITCHING AND NAUSEA AFTER C/S -states was awake. Probable duramorph reaction- will test dose propofol prior to induction  Reviewed: Allergy & Precautions, NPO status , Patient's Chart, lab work & pertinent test results  Airway Mallampati: II  TM Distance: >3 FB Neck ROM: Full    Dental no notable dental hx. (+) Teeth Intact   Pulmonary asthma ,    Pulmonary exam normal breath sounds clear to auscultation       Cardiovascular Exercise Tolerance: Good hypertension, negative cardio ROS Normal cardiovascular examI Rhythm:Regular Rate:Normal     Neuro/Psych Anxiety Depression negative neurological ROS  negative psych ROS   GI/Hepatic negative GI ROS, Neg liver ROS,   Endo/Other  diabetes, Type 2, Oral Hypoglycemic AgentsHypothyroidism   Renal/GU negative Renal ROS  negative genitourinary   Musculoskeletal negative musculoskeletal ROS (+)   Abdominal   Peds negative pediatric ROS (+)  Hematology negative hematology ROS (+)   Anesthesia Other Findings   Reproductive/Obstetrics negative OB ROS                             Anesthesia Physical Anesthesia Plan  ASA: III  Anesthesia Plan: General   Post-op Pain Management:    Induction: Intravenous  PONV Risk Score and Plan: 3 and Dexamethasone, Ondansetron, Treatment may vary due to age or medical condition and Promethazine  Airway Management Planned: Oral ETT  Additional Equipment:   Intra-op Plan:   Post-operative Plan: Extubation in OR  Informed Consent: I have reviewed the patients History and Physical, chart, labs and discussed the procedure including the risks, benefits and alternatives for the proposed anesthesia with the patient or authorized representative who has indicated his/her understanding and  acceptance.     Dental advisory given  Plan Discussed with: CRNA  Anesthesia Plan Comments: (Plan full ppe use Plan GETA D/W PT -wtp with same after Q&A)        Anesthesia Quick Evaluation

## 2019-02-19 NOTE — Interval H&P Note (Signed)
History and Physical Interval Note:  02/19/2019 7:13 AM  Krista [name]  has presented today for surgery, with the diagnosis of Fibroids Dysmenorrhea Menorrhagia.  The various methods of treatment have been discussed with the patient and family. After consideration of risks, benefits and other options for treatment, the patient has consented to  Procedure(s): HYSTERECTOMY SUPRACERVICAL ABDOMINAL (N/A) OPEN BILATERAL SALPINGECTOMY (Bilateral) as a surgical intervention.  The patient's history has been reviewed, patient examined, no change in status, stable for surgery.  I have reviewed the patient's chart and labs.  Questions were answered to the patient's satisfaction.     Florian Buff

## 2019-02-20 ENCOUNTER — Encounter (HOSPITAL_COMMUNITY): Payer: Self-pay | Admitting: Obstetrics & Gynecology

## 2019-02-20 LAB — BASIC METABOLIC PANEL
Anion gap: 10 (ref 5–15)
BUN: 12 mg/dL (ref 6–20)
CO2: 23 mmol/L (ref 22–32)
Calcium: 8.9 mg/dL (ref 8.9–10.3)
Chloride: 107 mmol/L (ref 98–111)
Creatinine, Ser: 0.89 mg/dL (ref 0.44–1.00)
GFR calc Af Amer: >60 mL/min (ref >60)
GFR calc non Af Amer: >60 mL/min (ref >60)
Glucose, Bld: 134 mg/dL — ABNORMAL HIGH (ref 70–99)
Potassium: 3.9 mmol/L (ref 3.5–5.1)
Sodium: 140 mmol/L (ref 135–145)

## 2019-02-20 LAB — CBC
HCT: 39.2 % (ref 36.0–46.0)
Hemoglobin: 12 g/dL (ref 12.0–15.0)
MCH: 26.7 pg (ref 26.0–34.0)
MCHC: 30.6 g/dL (ref 30.0–36.0)
MCV: 87.3 fL (ref 80.0–100.0)
Platelets: 231 10*3/uL (ref 150–400)
RBC: 4.49 MIL/uL (ref 3.87–5.11)
RDW: 13.7 % (ref 11.5–15.5)
WBC: 10.7 10*3/uL — ABNORMAL HIGH (ref 4.0–10.5)
nRBC: 0 % (ref 0.0–0.2)

## 2019-02-20 MED ORDER — ONDANSETRON HCL 8 MG PO TABS: 8.0000 mg | ORAL_TABLET | Freq: Four times a day (QID) | ORAL | 0 refills | Status: DC | PRN

## 2019-02-20 MED ORDER — OXYCODONE HCL 5 MG PO TABS: 5.0000 mg | ORAL_TABLET | ORAL | 0 refills | Status: DC | PRN

## 2019-02-20 MED ORDER — KETOROLAC TROMETHAMINE 10 MG PO TABS: 10.0000 mg | ORAL_TABLET | Freq: Three times a day (TID) | ORAL | 0 refills | Status: DC | PRN

## 2019-02-20 NOTE — Plan of Care (Signed)
  Problem: Education: Goal: Knowledge of General Education information will improve Description: Including pain rating scale, medication(s)/side effects and non-pharmacologic comfort measures 02/20/2019 1210 by Angelyn Punt, RN Outcome: Adequate for Discharge 02/20/2019 1134 by Angelyn Punt, RN Outcome: Progressing   Problem: Health Behavior/Discharge Planning: Goal: Ability to manage health-related needs will improve 02/20/2019 1210 by Angelyn Punt, RN Outcome: Adequate for Discharge 02/20/2019 1134 by Angelyn Punt, RN Outcome: Progressing   Problem: Clinical Measurements: Goal: Ability to maintain clinical measurements within normal limits will improve 02/20/2019 1210 by Angelyn Punt, RN Outcome: Adequate for Discharge 02/20/2019 1134 by Angelyn Punt, RN Outcome: Progressing Goal: Will remain free from infection 02/20/2019 1210 by Angelyn Punt, RN Outcome: Adequate for Discharge 02/20/2019 1134 by Angelyn Punt, RN Outcome: Progressing Goal: Diagnostic test results will improve 02/20/2019 1210 by Angelyn Punt, RN Outcome: Adequate for Discharge 02/20/2019 1134 by Angelyn Punt, RN Outcome: Progressing Goal: Respiratory complications will improve 02/20/2019 1210 by Angelyn Punt, RN Outcome: Adequate for Discharge 02/20/2019 1134 by Angelyn Punt, RN Outcome: Progressing Goal: Cardiovascular complication will be avoided 02/20/2019 1210 by Angelyn Punt, RN Outcome: Adequate for Discharge 02/20/2019 1134 by Angelyn Punt, RN Outcome: Progressing   Problem: Activity: Goal: Risk for activity intolerance will decrease 02/20/2019 1210 by Angelyn Punt, RN Outcome: Adequate for Discharge 02/20/2019 1134 by Angelyn Punt, RN Outcome: Progressing   Problem: Nutrition: Goal: Adequate nutrition will be maintained 02/20/2019 1210 by Angelyn Punt, RN Outcome: Adequate for Discharge 02/20/2019 1134 by  Angelyn Punt, RN Outcome: Progressing   Problem: Coping: Goal: Level of anxiety will decrease 02/20/2019 1210 by Angelyn Punt, RN Outcome: Adequate for Discharge 02/20/2019 1134 by Angelyn Punt, RN Outcome: Progressing   Problem: Elimination: Goal: Will not experience complications related to bowel motility 02/20/2019 1210 by Angelyn Punt, RN Outcome: Adequate for Discharge 02/20/2019 1134 by Angelyn Punt, RN Outcome: Progressing Goal: Will not experience complications related to urinary retention 02/20/2019 1210 by Angelyn Punt, RN Outcome: Adequate for Discharge 02/20/2019 1134 by Angelyn Punt, RN Outcome: Progressing   Problem: Pain Managment: Goal: General experience of comfort will improve 02/20/2019 1210 by Angelyn Punt, RN Outcome: Adequate for Discharge 02/20/2019 1134 by Angelyn Punt, RN Outcome: Progressing   Problem: Safety: Goal: Ability to remain free from injury will improve 02/20/2019 1210 by Angelyn Punt, RN Outcome: Adequate for Discharge 02/20/2019 1134 by Angelyn Punt, RN Outcome: Progressing   Problem: Skin Integrity: Goal: Risk for impaired skin integrity will decrease 02/20/2019 1210 by Angelyn Punt, RN Outcome: Adequate for Discharge 02/20/2019 1134 by Angelyn Punt, RN Outcome: Progressing

## 2019-02-20 NOTE — Anesthesia Postprocedure Evaluation (Signed)
Anesthesia Post Note Late Entry for 02/19/19 1140  Patient: Krista [name]  Procedure(s) Performed: HYSTERECTOMY SUPRACERVICAL ABDOMINAL (N/A Abdomen) OPEN BILATERAL SALPINGECTOMY (Bilateral Abdomen)  Patient location during evaluation: Nursing Unit Anesthesia Type: General Level of consciousness: awake and alert, oriented and patient cooperative Pain management: pain level controlled Vital Signs Assessment: post-procedure vital signs reviewed and stable Respiratory status: spontaneous breathing Anesthetic complications: no     Last Vitals:  Vitals:   02/20/19 0406 02/20/19 1023  BP: 130/81 133/86  Pulse: 85 92  Resp: 16 16  Temp: 37 C   SpO2: 97% 100%    Last Pain:  Vitals:   02/20/19 1023  TempSrc:   PainSc: 3                  Levaeh Vice A

## 2019-02-20 NOTE — Discharge Summary (Signed)
Physician Discharge Summary  Patient ID: Krista [name] MRN: [id number] DOB/AGE: 08/20/1975 43 y.o.  Admit date: 02/19/2019 Discharge date: 02/20/2019  Admission Diagnoses: S/p supracervical hysterectomy  Discharge Diagnoses:  Active Problems:   S/P hysterectomy   Discharged Condition: good  Hospital Course: unremarkable post op course  Consults: None  Significant Diagnostic Studies: labs:  Results for orders placed or performed during the hospital encounter of 02/19/19 (from the past 24 hour(s))  CBC     Status: Abnormal   Collection Time: 02/20/19  5:14 AM  Result Value Ref Range   WBC 10.7 (H) 4.0 - 10.5 K/uL   RBC 4.49 3.87 - 5.11 MIL/uL   Hemoglobin 12.0 12.0 - 15.0 g/dL   HCT 39.2 36.0 - 46.0 %   MCV 87.3 80.0 - 100.0 fL   MCH 26.7 26.0 - 34.0 pg   MCHC 30.6 30.0 - 36.0 g/dL   RDW 13.7 11.5 - 15.5 %   Platelets 231 150 - 400 K/uL   nRBC 0.0 0.0 - 0.2 %  Basic metabolic panel     Status: Abnormal   Collection Time: 02/20/19  5:14 AM  Result Value Ref Range   Sodium 140 135 - 145 mmol/L   Potassium 3.9 3.5 - 5.1 mmol/L   Chloride 107 98 - 111 mmol/L   CO2 23 22 - 32 mmol/L   Glucose, Bld 134 (H) 70 - 99 mg/dL   BUN 12 6 - 20 mg/dL   Creatinine, Ser 0.89 0.44 - 1.00 mg/dL   Calcium 8.9 8.9 - 10.3 mg/dL   GFR calc non Af Amer >60 >60 mL/min   GFR calc Af Amer >60 >60 mL/min   Anion gap 10 5 - 15    Treatments: surgery: supracervical hysterectomy  Discharge Exam: Blood pressure 133/86, pulse 92, temperature 98.6 F (37 C), temperature source Oral, resp. rate 16, height 5\' 10"  (1.778 m), weight (!) 140.6 kg, SpO2 100 %. General appearance: alert, cooperative and no distress GI: soft, non-tender; bowel sounds normal; no masses,  no organomegaly Incision/Wound:clean dry intact  Disposition: Discharge disposition: 01-Home or Self Care       Discharge Instructions    Call MD for:  persistant nausea and vomiting   Complete by: As directed    Call MD  for:  severe uncontrolled pain   Complete by: As directed    Call MD for:  temperature >100.4   Complete by: As directed    Diet - low sodium heart healthy   Complete by: As directed    Driving Restrictions   Complete by: As directed    No driving for 1 week   Increase activity slowly   Complete by: As directed    Lifting restrictions   Complete by: As directed    No lifting more than 10 pounds for 6 weeks   No wound care   Complete by: As directed    Sexual Activity Restrictions   Complete by: As directed    No sex for 6 weeks     Allergies as of 02/20/2019      Reactions   Propofol Itching, Rash      Medication List    STOP taking these medications   medroxyPROGESTERone 150 MG/ML injection Commonly known as: DEPO-PROVERA   megestrol 40 MG tablet Commonly known as: MEGACE     TAKE these medications   ALPRAZolam 0.5 MG tablet Commonly known as: XANAX TAKE 1 TABLET BY MOUTH THREE TIMES A DAY AS NEEDED FOR  ANXIETY What changed: See the new instructions.   Armour Thyroid 60 MG tablet Generic drug: thyroid Take 60 mg by mouth daily before breakfast.   DULoxetine 60 MG capsule Commonly known as: CYMBALTA Take 120 mg by mouth at bedtime.   fexofenadine 180 MG tablet Commonly known as: ALLEGRA Take 180 mg by mouth daily.   gabapentin 300 MG capsule Commonly known as: NEURONTIN Take 300 mg by mouth at bedtime.   hydrochlorothiazide 25 MG tablet Commonly known as: HYDRODIURIL Take 1 tablet (25 mg total) by mouth daily.   hydrOXYzine 10 MG tablet Commonly known as: ATARAX/VISTARIL Take 1 tablet (10 mg total) by mouth 3 (three) times daily as needed.   ketorolac 10 MG tablet Commonly known as: TORADOL Take 1 tablet (10 mg total) by mouth every 8 (eight) hours as needed.   lisdexamfetamine 70 MG capsule Commonly known as: VYVANSE Take 70 mg by mouth daily.   lisinopril 10 MG tablet Commonly known as: ZESTRIL Take 1 tablet (10 mg total) by mouth  daily. What changed: how much to take   meclizine 25 MG tablet Commonly known as: ANTIVERT Take 25 mg by mouth 3 (three) times daily as needed for dizziness or nausea.   Melatonin 5 MG Tabs Take 5 mg by mouth at bedtime as needed (sleep).   meloxicam 15 MG tablet Commonly known as: MOBIC Take 15 mg by mouth daily.   metFORMIN 500 MG tablet Commonly known as: GLUCOPHAGE Take 1 tablet (500 mg total) by mouth 2 (two) times daily.   montelukast 10 MG tablet Commonly known as: SINGULAIR Take 10 mg by mouth at bedtime.   naproxen sodium 550 MG tablet Commonly known as: ANAPROX TAKE ONE TABLET BY MOUTH EVERY 8-12 HOURS AS NEEDED CRAMPS   ondansetron 8 MG tablet Commonly known as: ZOFRAN Take 1 tablet (8 mg total) by mouth every 6 (six) hours as needed for nausea.   oxyCODONE 5 MG immediate release tablet Commonly known as: Oxy IR/ROXICODONE Take 1-2 tablets (5-10 mg total) by mouth every 4 (four) hours as needed for moderate pain.   oxymetazoline 0.05 % nasal spray Commonly known as: AFRIN Place 1 spray into both nostrils at bedtime as needed for congestion.   phenazopyridine 200 MG tablet Commonly known as: Pyridium Take 1 tablet (200 mg total) by mouth 3 (three) times daily as needed for pain.   ProAir HFA 108 (90 Base) MCG/ACT inhaler Generic drug: albuterol Inhale 2 puffs into the lungs every 6 (six) hours as needed for wheezing or shortness of breath.      Follow-up Information    Florian Buff, MD Follow up on 02/28/2019.   Specialties: Obstetrics and Gynecology, Radiology Why: post op visit Contact information: [place] [id number] [phone]           Signed: Florian Buff 02/20/2019, 10:45 AM

## 2019-02-20 NOTE — Discharge Instructions (Signed)
Abdominal Hysterectomy, Care After °This sheet gives you information about how to care for yourself after your procedure. Your health care provider may also give you more specific instructions. If you have problems or questions, contact your health care provider. °What can I expect after the procedure? °After your procedure, it is common to have: °· Pain. °· Fatigue. °· Poor appetite. °· Less interest in sex. °· Vaginal bleeding and discharge. You may need to use a sanitary napkin after this procedure. °Follow these instructions at home: °Bathing °· Do not take baths, swim, or use a hot tub until your health care provider approves. Ask your health care provider if you can take showers. You may only be allowed to take sponge baths for bathing. °· Keep the bandage (dressing) dry until your health care provider says it can be removed. °Incision care ° °· Follow instructions from your health care provider about how to take care of your incision. Make sure you: °? Wash your hands with soap and water before you change your bandage (dressing). If soap and water are not available, use hand sanitizer. °? Change your dressing as told by your health care provider. °? Leave stitches (sutures), skin glue, or adhesive strips in place. These skin closures may need to stay in place for 2 weeks or longer. If adhesive strip edges start to loosen and curl up, you may trim the loose edges. Do not remove adhesive strips completely unless your health care provider tells you to do that. °· Check your incision area every day for signs of infection. Check for: °? Redness, swelling, or pain. °? Fluid or blood. °? Warmth. °? Pus or a bad smell. °Activity °· Do gentle, daily exercises as told by your health care provider. You may be told to take short walks every day and go farther each time. °· Do not lift anything that is heavier than 10 lb (4.5 kg), or the limit that your health care provider tells you, until he or she says that it is  safe. °· Do not drive or use heavy machinery while taking prescription pain medicine. °· Do not drive for 24 hours if you were given a medicine to help you relax (sedative). °· Follow your health care provider's instructions about exercise, driving, and general activities. Ask your health care provider what activities are safe for you. °Lifestyle °· Do not douche, use tampons, or have sex for at least 6 weeks or as told by your health care provider. °· Do not drink alcohol until your health care provider approves. °· Drink enough fluid to keep your urine clear or pale yellow. °· Try to have someone at home with you for the first 1-2 weeks to help. °· Do not use any products that contain nicotine or tobacco, such as cigarettes and e-cigarettes. These can delay healing. If you need help quitting, ask your health care provider. °General instructions °· Take over-the-counter and prescription medicines only as told by your health care provider. °· Do not take aspirin or ibuprofen. These medicines can cause bleeding. °· To prevent or treat constipation while you are taking prescription pain medicine, your health care provider may recommend that you: °? Drink enough fluid to keep your urine clear or pale yellow. °? Take over-the-counter or prescription medicines. °? Eat foods that are high in fiber, such as fresh fruits and vegetables, whole grains, and beans. °? Limit foods that are high in fat and processed sugars, such as fried and sweet foods. °· Keep all   follow-up visits as told by your health care provider. This is important. °Contact a health care provider if: °· You have chills or fever. °· You have redness, swelling, or pain around your incision. °· You have fluid or blood coming from your incision. °· Your incision feels warm to the touch. °· You have pus or a bad smell coming from your incision. °· Your incision breaks open. °· You feel dizzy or light-headed. °· You have pain or bleeding when you urinate. °· You  have persistent diarrhea. °· You have persistent nausea and vomiting. °· You have abnormal vaginal discharge. °· You have a rash. °· You have any type of abnormal reaction or you develop an allergy to your medicine. °· Your pain medicine does not help. °Get help right away if: °· You have a fever and your symptoms suddenly get worse. °· You have severe abdominal pain. °· You have shortness of breath. °· You faint. °· You have pain, swelling, or redness in your leg. °· You have heavy vaginal bleeding with blood clots. °Summary °· After your procedure, it is common to have pain, fatigue and vaginal discharge. °· Do not take baths, swim, or use a hot tub until your health care provider approves. Ask your health care provider if you can take showers. You may only be allowed to take sponge baths for bathing. °· Follow your health care provider's instructions about exercise, driving, and general activities. Ask your health care provider what activities are safe for you. °· Do not lift anything that is heavier than 10 lb (4.5 kg), or the limit that your health care provider tells you, until he or she says that it is safe. °· Try to have someone at home with you for the first 1-2 weeks to help. °This information is not intended to replace advice given to you by your health care provider. Make sure you discuss any questions you have with your health care provider. °Document Released: 12/23/2004 Document Revised: 07/09/2018 Document Reviewed: 05/24/2016 °Elsevier Patient Education © 2020 [employer] ° °

## 2019-02-20 NOTE — Plan of Care (Signed)
Waiting on MD to round. Pt with mild soreness, denies n/v. Pt is hoping for discharge home today. Problem: Education: Goal: Knowledge of General Education information will improve Description: Including pain rating scale, medication(s)/side effects and non-pharmacologic comfort measures Outcome: Progressing   Problem: Health Behavior/Discharge Planning: Goal: Ability to manage health-related needs will improve Outcome: Progressing   Problem: Clinical Measurements: Goal: Ability to maintain clinical measurements within normal limits will improve Outcome: Progressing Goal: Will remain free from infection Outcome: Progressing Goal: Diagnostic test results will improve Outcome: Progressing Goal: Respiratory complications will improve Outcome: Progressing Goal: Cardiovascular complication will be avoided Outcome: Progressing   Problem: Activity: Goal: Risk for activity intolerance will decrease Outcome: Progressing   Problem: Nutrition: Goal: Adequate nutrition will be maintained Outcome: Progressing   Problem: Coping: Goal: Level of anxiety will decrease Outcome: Progressing   Problem: Elimination: Goal: Will not experience complications related to bowel motility Outcome: Progressing Goal: Will not experience complications related to urinary retention Outcome: Progressing   Problem: Pain Managment: Goal: General experience of comfort will improve Outcome: Progressing   Problem: Safety: Goal: Ability to remain free from injury will improve Outcome: Progressing   Problem: Skin Integrity: Goal: Risk for impaired skin integrity will decrease Outcome: Progressing

## 2019-02-23 ENCOUNTER — Other Ambulatory Visit: Payer: Self-pay | Admitting: Obstetrics & Gynecology

## 2019-02-25 ENCOUNTER — Other Ambulatory Visit: Payer: Self-pay | Admitting: Obstetrics & Gynecology

## 2019-02-25 MED ORDER — OXYCODONE HCL 5 MG PO TABS: 5.0000 mg | ORAL_TABLET | ORAL | 0 refills | Status: DC | PRN

## 2019-02-28 ENCOUNTER — Encounter: Payer: Self-pay | Admitting: Obstetrics & Gynecology

## 2019-02-28 ENCOUNTER — Other Ambulatory Visit: Payer: Self-pay

## 2019-02-28 ENCOUNTER — Ambulatory Visit (INDEPENDENT_AMBULATORY_CARE_PROVIDER_SITE_OTHER): Payer: BLUE CROSS/BLUE SHIELD | Admitting: Obstetrics & Gynecology

## 2019-02-28 VITALS — BP 160/82 | HR 90 | Ht 69.2 in | Wt 313.0 lb

## 2019-02-28 DIAGNOSIS — Z90710 Acquired absence of both cervix and uterus: Secondary | ICD-10-CM

## 2019-02-28 DIAGNOSIS — Z9889 Other specified postprocedural states: Secondary | ICD-10-CM

## 2019-02-28 NOTE — Progress Notes (Signed)
HPI: Patient returns for routine postoperative follow-up having undergone abdominal supracervicl hysterectomy on 02/19/2019.  The patient's immediate postoperative recovery has been unremarkable. Since hospital discharge the patient reports no problems.   Current Outpatient Medications: albuterol (PROAIR HFA) 108 (90 Base) MCG/ACT inhaler, Inhale 2 puffs into the lungs every 6 (six) hours as needed for wheezing or shortness of breath. , Disp: , Rfl:  ALPRAZolam (XANAX) 0.5 MG tablet, TAKE 1 TABLET BY MOUTH THREE TIMES A DAY AS NEEDED FOR ANXIETY (Patient taking differently: Take 0.5 mg by mouth 3 (three) times daily as needed for anxiety. ), Disp: 60 tablet, Rfl: 3 ARMOUR THYROID 60 MG tablet, Take 60 mg by mouth daily before breakfast., Disp: , Rfl:  DULoxetine (CYMBALTA) 60 MG capsule, Take 120 mg by mouth at bedtime., Disp: , Rfl:  fexofenadine (ALLEGRA) 180 MG tablet, Take 180 mg by mouth daily., Disp: , Rfl:  gabapentin (NEURONTIN) 300 MG capsule, Take 300 mg by mouth at bedtime. , Disp: , Rfl:  hydrochlorothiazide (HYDRODIURIL) 25 MG tablet, Take 1 tablet (25 mg total) by mouth daily., Disp: 90 tablet, Rfl: 2 hydrOXYzine (ATARAX/VISTARIL) 10 MG tablet, Take 1 tablet (10 mg total) by mouth 3 (three) times daily as needed., Disp: 30 tablet, Rfl: 0 ketorolac (TORADOL) 10 MG tablet, Take 1 tablet (10 mg total) by mouth every 8 (eight) hours as needed., Disp: 15 tablet, Rfl: 0 lisinopril (PRINIVIL,ZESTRIL) 10 MG tablet, Take 1 tablet (10 mg total) by mouth daily. (Patient taking differently: Take 20 mg by mouth daily. ), Disp: 90 tablet, Rfl: 3 meclizine (ANTIVERT) 25 MG tablet, Take 25 mg by mouth 3 (three) times daily as needed for dizziness or nausea. , Disp: , Rfl:  Melatonin 5 MG TABS, Take 5 mg by mouth at bedtime as needed (sleep). , Disp: , Rfl:  meloxicam (MOBIC) 15 MG tablet, Take 15 mg by mouth daily. , Disp: , Rfl:  metFORMIN (GLUCOPHAGE) 500 MG tablet, Take 1 tablet (500 mg total)  by mouth 2 (two) times daily., Disp: 180 tablet, Rfl: 3 montelukast (SINGULAIR) 10 MG tablet, Take 10 mg by mouth at bedtime. , Disp: , Rfl:  oxyCODONE (OXY IR/ROXICODONE) 5 MG immediate release tablet, Take 1-2 tablets (5-10 mg total) by mouth every 4 (four) hours as needed for moderate pain., Disp: 30 tablet, Rfl: 0 oxymetazoline (AFRIN) 0.05 % nasal spray, Place 1 spray into both nostrils at bedtime as needed for congestion. , Disp: , Rfl:  lisdexamfetamine (VYVANSE) 70 MG capsule, Take 70 mg by mouth daily. , Disp: , Rfl:  naproxen sodium (ANAPROX) 550 MG tablet, TAKE ONE TABLET BY MOUTH EVERY 8-12 HOURS AS NEEDED CRAMPS (Patient not taking: Reported on 02/12/2019), Disp: 30 tablet, Rfl: 2 ondansetron (ZOFRAN) 8 MG tablet, Take 1 tablet (8 mg total) by mouth every 6 (six) hours as needed for nausea. (Patient not taking: Reported on 02/28/2019), Disp: 20 tablet, Rfl: 0 phenazopyridine (PYRIDIUM) 200 MG tablet, Take 1 tablet (200 mg total) by mouth 3 (three) times daily as needed for pain. (Patient not taking: Reported on 01/21/2019), Disp: 10 tablet, Rfl: 0  No current facility-administered medications for this visit.     Blood pressure (!) 160/82, pulse 90, height 5' 9.2" (1.758 m), weight (!) 313 lb (142 kg), last menstrual period 06/01/2017.  Physical Exam: Slight bruising around her incision Incision clean dry intact Abdomen is benign  Diagnostic Tests:   Pathology: benign  Impression: S/p supracervical hysterectomy 02/19/2019  Plan: Routine initial post op care  Follow up:  4  weeks  Florian Buff, MD

## 2019-03-03 ENCOUNTER — Other Ambulatory Visit: Payer: Self-pay | Admitting: Women's Health

## 2019-03-03 MED ORDER — HYDROXYZINE HCL 10 MG PO TABS: 10.0000 mg | ORAL_TABLET | Freq: Three times a day (TID) | ORAL | 1 refills | Status: DC | PRN

## 2019-03-10 ENCOUNTER — Telehealth: Payer: Self-pay | Admitting: *Deleted

## 2019-03-10 NOTE — Telephone Encounter (Signed)
Patient states she is having severe pain on just one side of her incision and wants to discuss.

## 2019-03-10 NOTE — Telephone Encounter (Signed)
Pt had vaginal hyst and is having severe pain on left side of incision. No drainage. No vaginal bleeding. Pain has been constant since surgery. Has tried heat and ice. Advised needs an appt with Dr. Elonda Husky tomorrow or can go to ER if pain is severe enough. Pt wants appt here tomorrow. Call transferred to [facility]. for appt. [place]

## 2019-03-11 ENCOUNTER — Other Ambulatory Visit: Payer: Self-pay

## 2019-03-11 ENCOUNTER — Ambulatory Visit (INDEPENDENT_AMBULATORY_CARE_PROVIDER_SITE_OTHER): Payer: BLUE CROSS/BLUE SHIELD | Admitting: Obstetrics & Gynecology

## 2019-03-11 ENCOUNTER — Encounter: Payer: Self-pay | Admitting: Obstetrics & Gynecology

## 2019-03-11 VITALS — BP 163/86 | HR 107 | Ht 69.5 in | Wt 314.5 lb

## 2019-03-11 DIAGNOSIS — Z90710 Acquired absence of both cervix and uterus: Secondary | ICD-10-CM

## 2019-03-11 DIAGNOSIS — R109 Unspecified abdominal pain: Secondary | ICD-10-CM

## 2019-03-11 MED ORDER — CYCLOBENZAPRINE HCL 10 MG PO TABS: 10.0000 mg | ORAL_TABLET | Freq: Three times a day (TID) | ORAL | 1 refills | Status: DC | PRN

## 2019-03-11 NOTE — Progress Notes (Signed)
HPI: Patient returns for routine postoperative follow-up having undergone TAH  on 02/19/2019 who has had a relatively unremarkable post op course until she reached up for something out of a abinet and since has had pain in a t12 type dermatome on the left side radiating to back and hip, my opinion it is primarily back with her pain being felt in abdominal wall.  She is chronically on cymbalta and neurontin  The patient's immediate postoperative recovery has been unremarkable. Since hospital discharge the patient reports as above.   Current Outpatient Medications: albuterol (PROAIR HFA) 108 (90 Base) MCG/ACT inhaler, Inhale 2 puffs into the lungs every 6 (six) hours as needed for wheezing or shortness of breath. , Disp: , Rfl:  ALPRAZolam (XANAX) 0.5 MG tablet, TAKE 1 TABLET BY MOUTH THREE TIMES A DAY AS NEEDED FOR ANXIETY (Patient taking differently: Take 0.5 mg by mouth 3 (three) times daily as needed for anxiety. ), Disp: 60 tablet, Rfl: 3 ARMOUR THYROID 60 MG tablet, Take 60 mg by mouth daily before breakfast., Disp: , Rfl:  DULoxetine (CYMBALTA) 60 MG capsule, Take 120 mg by mouth at bedtime., Disp: , Rfl:  fexofenadine (ALLEGRA) 180 MG tablet, Take 180 mg by mouth daily., Disp: , Rfl:  gabapentin (NEURONTIN) 300 MG capsule, Take 300 mg by mouth at bedtime. , Disp: , Rfl:  hydrochlorothiazide (HYDRODIURIL) 25 MG tablet, Take 1 tablet (25 mg total) by mouth daily., Disp: 90 tablet, Rfl: 2 hydrOXYzine (ATARAX/VISTARIL) 10 MG tablet, Take 1 tablet (10 mg total) by mouth 3 (three) times daily as needed., Disp: 30 tablet, Rfl: 1 lisinopril (PRINIVIL,ZESTRIL) 10 MG tablet, Take 1 tablet (10 mg total) by mouth daily. (Patient taking differently: Take 20 mg by mouth daily. ), Disp: 90 tablet, Rfl: 3 meclizine (ANTIVERT) 25 MG tablet, Take 25 mg by mouth 3 (three) times daily as needed for dizziness or nausea. , Disp: , Rfl:  Melatonin 5 MG TABS, Take 5 mg by mouth at bedtime as needed (sleep). , Disp: ,  Rfl:  meloxicam (MOBIC) 15 MG tablet, Take 15 mg by mouth daily. , Disp: , Rfl:  metFORMIN (GLUCOPHAGE) 500 MG tablet, Take 1 tablet (500 mg total) by mouth 2 (two) times daily., Disp: 180 tablet, Rfl: 3 montelukast (SINGULAIR) 10 MG tablet, Take 10 mg by mouth at bedtime. , Disp: , Rfl:  lisdexamfetamine (VYVANSE) 70 MG capsule, Take 70 mg by mouth daily. , Disp: , Rfl:   No current facility-administered medications for this visit.     Blood pressure (!) 163/86, pulse (!) 107, height 5' 9.5" (1.765 m), weight (!) 314 lb 8 oz (142.7 kg), last menstrual period 06/01/2017.  Physical Exam: Incision clean dry intact Normal healing palpated in both corners No cellulitis or other abnormalities noted +triggers in the back especially on the left  Diagnostic Tests:   Pathology:   Impression: S/p TAH Left back and abdominal wall hip pain from extended reaching post op  Plan: Meds ordered this encounter  Medications  . DISCONTD: cyclobenzaprine (FLEXERIL) 10 MG tablet    Sig: Take 1 tablet (10 mg total) by mouth every 8 (eight) hours as needed for muscle spasms.    Dispense:  30 tablet    Refill:  1  . cyclobenzaprine (FLEXERIL) 10 MG tablet    Sig: Take 1 tablet (10 mg total) by mouth every 8 (eight) hours as needed for muscle spasms.    Dispense:  30 tablet    Refill:  1  Follow up: Keep scheduled    Florian Buff, MD

## 2019-03-23 ENCOUNTER — Other Ambulatory Visit: Payer: Self-pay

## 2019-03-23 ENCOUNTER — Encounter (HOSPITAL_COMMUNITY): Payer: Self-pay

## 2019-03-23 ENCOUNTER — Emergency Department (HOSPITAL_COMMUNITY)
Admission: EM | Admit: 2019-03-23 | Discharge: 2019-03-24 | Disposition: A | Discharge status: Home / Self Care | Payer: BLUE CROSS/BLUE SHIELD | Attending: Emergency Medicine | Admitting: Emergency Medicine

## 2019-03-23 DIAGNOSIS — Z79899 Other long term (current) drug therapy: Secondary | ICD-10-CM | POA: Diagnosis not present

## 2019-03-23 DIAGNOSIS — G8918 Other acute postprocedural pain: Secondary | ICD-10-CM | POA: Insufficient documentation

## 2019-03-23 DIAGNOSIS — E039 Hypothyroidism, unspecified: Secondary | ICD-10-CM | POA: Diagnosis not present

## 2019-03-23 DIAGNOSIS — E119 Type 2 diabetes mellitus without complications: Secondary | ICD-10-CM | POA: Insufficient documentation

## 2019-03-23 DIAGNOSIS — T8141XA Infection following a procedure, superficial incisional surgical site, initial encounter: Secondary | ICD-10-CM | POA: Insufficient documentation

## 2019-03-23 DIAGNOSIS — Y763 Surgical instruments, materials and obstetric and gynecological devices (including sutures) associated with adverse incidents: Secondary | ICD-10-CM | POA: Insufficient documentation

## 2019-03-23 DIAGNOSIS — I10 Essential (primary) hypertension: Secondary | ICD-10-CM | POA: Diagnosis not present

## 2019-03-23 DIAGNOSIS — Z7984 Long term (current) use of oral hypoglycemic drugs: Secondary | ICD-10-CM | POA: Insufficient documentation

## 2019-03-23 DIAGNOSIS — T8189XA Other complications of procedures, not elsewhere classified, initial encounter: Secondary | ICD-10-CM | POA: Diagnosis present

## 2019-03-23 MED ORDER — HYDROCODONE-ACETAMINOPHEN 5-325 MG PO TABS
1.0000 | ORAL_TABLET | Freq: Once | ORAL | Status: AC
  Administered 2019-03-23: 1 via ORAL
  Filled 2019-03-23: qty 1

## 2019-03-23 NOTE — ED Triage Notes (Signed)
Pt had an abdominal partial hysterectomy on 02/19/2019. Since then she has always had pain on the left side of the incision. Noticed today that it is oozing and is warm to the touch. [facility] call nurse and they said to get checked out in the ED. Said she had never hurt this bad in her life.   Surgeon Dr Elonda Husky.

## 2019-03-23 NOTE — ED Provider Notes (Signed)
[facility] EMERGENCY DEPARTMENT Provider Note   CSN: [id number] Arrival date & time: 03/23/19  2239     History   Chief Complaint Chief Complaint  Patient presents with  . Post-op Problem    HPI Krista [name] is a 43 y.o. female.     Patient with history of diabetes, obesity, hypertension presenting with postoperative pain and drainage from her wound.  States she had a abdominal partial hysterectomy on September 2 by Dr. Elonda Husky.  Since then she has had ongoing pain the left side of the incision and some area of firm induration to the left side of the incision.  Over the past several days she is noticed some increasing redness to the wound but the pain is been the same.  Today while she was fishing, she developed some drainage from the wound that she describes as clear but foul-smelling.  There is no bleeding.  States the area of pain is the same since she had the surgery.  She was told she was doing well in her postoperative check by Dr. Elonda Husky.  She denies any vomiting or fever.  She denies any change in bowel habits or urinary habits.  States her pain is severe and she was not given anything by the gynecologist because she was told it was gas. Denies any chest pain or shortness of breath.  Denies any pain with urination or blood in the urine.  The history is provided by the patient.    Past Medical History:  Diagnosis Date  . Allergy   . Anxiety   . Arthritis   . Asthma    childhood  . Depression   . Diabetes mellitus without complication ([place])   . Fibroids 02/13/2014  . Friable cervix 03/15/2015  . Hypertension   . Hypothyroid   . Irregular bleeding 03/15/2015  . Lyme disease   . Menometrorrhagia 02/11/2014  . Obesity   . Rosacea   . Severe menstrual cramps 09/17/2015  . Superficial fungus infection of skin 10/20/2015  . Weight gain 08/31/2015    Patient Active Problem List   Diagnosis Date Noted  . S/P hysterectomy 02/19/2019  . Dysmenorrhea 01/06/2019  .  Menorrhagia with regular cycle 01/06/2019  . Screening for colorectal cancer 12/26/2018  . Encounter for gynecological examination with Papanicolaou smear of cervix 12/26/2018  . Positive fecal occult blood test 12/26/2018  . Cervicitis 12/26/2018  . Hematuria 11/21/2016  . Pain with urination 11/21/2016  . Superficial fungus infection of skin 10/20/2015  . Anxiety 09/17/2015  . Severe menstrual cramps 09/17/2015  . Weight gain 08/31/2015  . Irregular bleeding 03/15/2015  . Friable cervix 03/15/2015  . Hypothyroid 03/15/2015  . Fibroids 02/13/2014  . Menometrorrhagia 02/11/2014  . Candidiasis of vulva and vagina 01/01/2013  . Depression 09/27/2012  . Obesity 09/27/2012  . Essential hypertension 07/10/2008  . ALLERGIC RHINITIS 07/10/2008  . ACQUIRED CYST OF KIDNEY 07/09/2008    Past Surgical History:  Procedure Laterality Date  . BILATERAL SALPINGECTOMY Bilateral 02/19/2019   Procedure: OPEN BILATERAL SALPINGECTOMY;  Surgeon: Florian Buff, MD;  Location: [facility];  Service: Gynecology;  Laterality: Bilateral;  . CESAREAN SECTION    . CHOLECYSTECTOMY    . ingrown toe nail removal  Bilateral   . ovarian cyst removal  Right   . SUPRACERVICAL ABDOMINAL HYSTERECTOMY N/A 02/19/2019   Procedure: HYSTERECTOMY SUPRACERVICAL ABDOMINAL;  Surgeon: Florian Buff, MD;  Location: [facility];  Service: Gynecology;  Laterality: N/A;  . TONSILLECTOMY    .  WISDOM TOOTH EXTRACTION       OB History    Gravida  5   Para  3   Term      Preterm      AB  2   Living  3     SAB  2   TAB      Ectopic      Multiple      Live Births  3            Home Medications    Prior to Admission medications   Medication Sig Start Date End Date Taking? Authorizing Provider  albuterol (PROAIR HFA) 108 (90 Base) MCG/ACT inhaler Inhale 2 puffs into the lungs every 6 (six) hours as needed for wheezing or shortness of breath.  10/01/18   [provider]  ALPRAZolam (XANAX) 0.5 MG tablet  TAKE 1 TABLET BY MOUTH THREE TIMES A DAY AS NEEDED FOR ANXIETY Patient taking differently: Take 0.5 mg by mouth 3 (three) times daily as needed for anxiety.  04/29/18   Florian Buff, MD  ARMOUR THYROID 60 MG tablet Take 60 mg by mouth daily before breakfast. 01/01/19   [provider]  cyclobenzaprine (FLEXERIL) 10 MG tablet Take 1 tablet (10 mg total) by mouth every 8 (eight) hours as needed for muscle spasms. 03/11/19   Florian Buff, MD  DULoxetine (CYMBALTA) 60 MG capsule Take 120 mg by mouth at bedtime. 10/23/18   [provider]  fexofenadine (ALLEGRA) 180 MG tablet Take 180 mg by mouth daily.    [provider]  gabapentin (NEURONTIN) 300 MG capsule Take 300 mg by mouth at bedtime.     [provider]  hydrochlorothiazide (HYDRODIURIL) 25 MG tablet Take 1 tablet (25 mg total) by mouth daily. 05/14/17   Estill Dooms, NP  hydrOXYzine (ATARAX/VISTARIL) 10 MG tablet Take 1 tablet (10 mg total) by mouth 3 (three) times daily as needed. 03/03/19   Roma Schanz, CNM  lisdexamfetamine (VYVANSE) 70 MG capsule Take 70 mg by mouth daily.  11/04/18 02/12/19  [provider]  lisinopril (PRINIVIL,ZESTRIL) 10 MG tablet Take 1 tablet (10 mg total) by mouth daily. Patient taking differently: Take 20 mg by mouth daily.  08/06/17   Estill Dooms, NP  meclizine (ANTIVERT) 25 MG tablet Take 25 mg by mouth 3 (three) times daily as needed for dizziness or nausea.  01/02/18   [provider]  Melatonin 5 MG TABS Take 5 mg by mouth at bedtime as needed (sleep).     [provider]  meloxicam (MOBIC) 15 MG tablet Take 15 mg by mouth daily.  03/02/18   [provider]  metFORMIN (GLUCOPHAGE) 500 MG tablet Take 1 tablet (500 mg total) by mouth 2 (two) times daily. 05/14/17   Florian Buff, MD  montelukast (SINGULAIR) 10 MG tablet Take 10 mg by mouth at bedtime.  10/01/18   [provider]    Family History Family History   Problem Relation Age of Onset  . Diabetes Mother   . Hypertension Mother   . Heart attack Father   . Hypertension Father   . Depression Sister   . Diabetes Maternal Grandmother   . Coronary artery disease Maternal Grandmother   . Diabetes Maternal Grandfather   . Coronary artery disease Maternal Grandfather   . Coronary artery disease Paternal Grandmother   . Diabetes Paternal Grandmother   . Coronary artery disease Paternal Grandfather     Social History  Social History   Tobacco Use  . Smoking status: Never Smoker  . Smokeless tobacco: Never Used  Substance Use Topics  . Alcohol use: Not Currently    Alcohol/week: 0.0 standard drinks    Comment: rarely  . Drug use: No     Allergies   Propofol   Review of Systems Review of Systems  Constitutional: Negative for activity change, appetite change and fever.  HENT: Negative for congestion and rhinorrhea.   Eyes: Negative for visual disturbance.  Respiratory: Negative for cough, chest tightness and shortness of breath.   Cardiovascular: Negative for chest pain.  Gastrointestinal: Positive for abdominal pain. Negative for nausea and vomiting.  Genitourinary: Negative for dysuria and hematuria.  Musculoskeletal: Negative for arthralgias, back pain and myalgias.  Skin: Positive for wound.  Neurological: Negative for dizziness, weakness and headaches.   all other systems are negative except as noted in the HPI and PMH.     Physical Exam Updated Vital Signs BP (!) 176/100 (BP Location: Right Arm)   Pulse 99   Temp 99.1 F (37.3 C) (Oral)   Resp 19   Ht 5\' 9"  (1.753 m)   Wt (!) 142.7 kg   LMP 06/01/2017   SpO2 100%   BMI 46.44 kg/m   Physical Exam Vitals signs and nursing note reviewed.  Constitutional:      General: She is not in acute distress.    Appearance: She is well-developed. She is obese.  HENT:     Head: Normocephalic and atraumatic.     Mouth/Throat:     Pharynx: No oropharyngeal exudate.  Eyes:      Conjunctiva/sclera: Conjunctivae normal.     Pupils: Pupils are equal, round, and reactive to light.  Neck:     Musculoskeletal: Normal range of motion and neck supple.     Comments: No meningismus. Cardiovascular:     Rate and Rhythm: Normal rate and regular rhythm.     Heart sounds: Normal heart sounds. No murmur.  Pulmonary:     Effort: Pulmonary effort is normal. No respiratory distress.     Breath sounds: Normal breath sounds.  Abdominal:     Palpations: Abdomen is soft.     Tenderness: There is no abdominal tenderness. There is no guarding or rebound.     Comments: Abdomen soft and nontender.  Surgical wound is depicted across her pannus.  There is erythema to the left side of the wound with surrounding induration without discrete fluctuance.  There is some purulent drainage present with palpation of the left side of the wound.  Musculoskeletal: Normal range of motion.        General: No tenderness.  Skin:    General: Skin is warm.     Capillary Refill: Capillary refill takes less than 2 seconds.  Neurological:     General: No focal deficit present.     Mental Status: She is alert and oriented to person, place, and time. Mental status is at baseline.     Cranial Nerves: No cranial nerve deficit.     Motor: No abnormal muscle tone.     Coordination: Coordination normal.     Comments:  5/5 strength throughout. CN 2-12 intact.Equal grip strength.   Psychiatric:        Behavior: Behavior normal.        ED Treatments / Results  Labs (all labs ordered are listed, but only abnormal results are displayed) Labs Reviewed - No data to display  EKG None  Radiology No  results found.  Procedures Ultrasound ED Soft Tissue  Date/Time: 03/23/2019 11:17 PM Performed by: Ezequiel Essex, MD Authorized by: Ezequiel Essex, MD   Procedure details:    Indications: localization of abscess and evaluate for cellulitis     Transverse view:  Visualized   Longitudinal view:   Visualized   Images: archived     Limitations:  Body habitus Location:    Location: abdominal wall     Side:  Left Findings:     no abscess present    cellulitis present    no foreign body present   (including critical care time)  Medications Ordered in ED Medications  HYDROcodone-acetaminophen (NORCO/VICODIN) 5-325 MG per tablet 1 tablet (has no administration in time range)     Initial Impression / Assessment and Plan / ED Course  I have reviewed the triage vital signs and the nursing notes.  Pertinent labs & imaging results that were available during my care of the patient were reviewed by me and considered in my medical decision making (see chart for details).       Redness and drainage to hysterectomy surgical site.  No fever or vomiting.  Temperature 99.1 on arrival.  Patient states her pain has been the same.  On bedside ultrasound she has cobblestoning with no discrete drainable fluid collection.  Per records, patient was healing well after her last postoperative check September 22 by Dr. Elonda Husky.  D/w Dr. Ilda Basset on call for Dr. Elonda Husky.  He agrees with antibiotics and have patient call the office in the morning.  Recommends warm compresses.  The patient appears well and is nontoxic and afebrile.  Does not recommend any additional abdominal imaging tonight.  Plan discussed with patient and husband.  They are agreeable.  First dose of antibiotics given.  Call Dr. Brynda Greathouse office in the morning for appointment as soon as possible.  Return precautions discussed. Final Clinical Impressions(s) / ED Diagnoses   Final diagnoses:  Infection of superficial incisional surgical site after procedure, initial encounter    ED Discharge Orders         Ordered    doxycycline (VIBRAMYCIN) 100 MG capsule  2 times daily     03/24/19 0125           Ezequiel Essex, MD 03/24/19 [phone]

## 2019-03-24 ENCOUNTER — Encounter: Payer: Self-pay | Admitting: Obstetrics and Gynecology

## 2019-03-24 ENCOUNTER — Other Ambulatory Visit: Payer: Self-pay

## 2019-03-24 ENCOUNTER — Ambulatory Visit (INDEPENDENT_AMBULATORY_CARE_PROVIDER_SITE_OTHER): Payer: BLUE CROSS/BLUE SHIELD | Admitting: Obstetrics and Gynecology

## 2019-03-24 VITALS — BP 148/57 | HR 108 | Ht 69.2 in | Wt 318.4 lb

## 2019-03-24 DIAGNOSIS — T8149XA Infection following a procedure, other surgical site, initial encounter: Secondary | ICD-10-CM | POA: Insufficient documentation

## 2019-03-24 DIAGNOSIS — Z90710 Acquired absence of both cervix and uterus: Secondary | ICD-10-CM

## 2019-03-24 LAB — CBC WITH DIFFERENTIAL/PLATELET
Abs Immature Granulocytes: 0.07 10*3/uL (ref 0.00–0.07)
Basophils Absolute: 0.1 10*3/uL (ref 0.0–0.1)
Basophils Relative: 1 %
Eosinophils Absolute: 0.4 10*3/uL (ref 0.0–0.5)
Eosinophils Relative: 4 %
HCT: 40.1 % (ref 36.0–46.0)
Hemoglobin: 12.4 g/dL (ref 12.0–15.0)
Immature Granulocytes: 1 %
Lymphocytes Relative: 29 %
Lymphs Abs: 3.4 10*3/uL (ref 0.7–4.0)
MCH: 26.4 pg (ref 26.0–34.0)
MCHC: 30.9 g/dL (ref 30.0–36.0)
MCV: 85.5 fL (ref 80.0–100.0)
Monocytes Absolute: 0.7 10*3/uL (ref 0.1–1.0)
Monocytes Relative: 6 %
Neutro Abs: 7 10*3/uL (ref 1.7–7.7)
Neutrophils Relative %: 59 %
Platelets: 308 10*3/uL (ref 150–400)
RBC: 4.69 MIL/uL (ref 3.87–5.11)
RDW: 13.5 % (ref 11.5–15.5)
WBC: 11.7 10*3/uL — ABNORMAL HIGH (ref 4.0–10.5)
nRBC: 0 % (ref 0.0–0.2)

## 2019-03-24 LAB — COMPREHENSIVE METABOLIC PANEL
ALT: 33 U/L (ref 0–44)
AST: 19 U/L (ref 15–41)
Albumin: 4 g/dL (ref 3.5–5.0)
Alkaline Phosphatase: 68 U/L (ref 38–126)
Anion gap: 10 (ref 5–15)
BUN: 27 mg/dL — ABNORMAL HIGH (ref 6–20)
CO2: 22 mmol/L (ref 22–32)
Calcium: 9.5 mg/dL (ref 8.9–10.3)
Chloride: 108 mmol/L (ref 98–111)
Creatinine, Ser: 0.93 mg/dL (ref 0.44–1.00)
GFR calc Af Amer: >60 mL/min (ref >60)
GFR calc non Af Amer: >60 mL/min (ref >60)
Glucose, Bld: 213 mg/dL — ABNORMAL HIGH (ref 70–99)
Potassium: 3.7 mmol/L (ref 3.5–5.1)
Sodium: 140 mmol/L (ref 135–145)
Total Bilirubin: 0.2 mg/dL — ABNORMAL LOW (ref 0.3–1.2)
Total Protein: 7.1 g/dL (ref 6.5–8.1)

## 2019-03-24 MED ORDER — FLUCONAZOLE 150 MG PO TABS: 150.0000 mg | ORAL_TABLET | Freq: Once | ORAL | 1 refills | Status: AC

## 2019-03-24 MED ORDER — AMOXICILLIN-POT CLAVULANATE 875-125 MG PO TABS: 1.0000 | ORAL_TABLET | Freq: Two times a day (BID) | ORAL | 0 refills | Status: DC

## 2019-03-24 MED ORDER — DOXYCYCLINE HYCLATE 100 MG PO TABS
100.0000 mg | ORAL_TABLET | Freq: Once | ORAL | Status: AC
  Administered 2019-03-24: 100 mg via ORAL
  Filled 2019-03-24: qty 1

## 2019-03-24 MED ORDER — DOXYCYCLINE HYCLATE 100 MG PO CAPS: 100.0000 mg | ORAL_CAPSULE | Freq: Two times a day (BID) | ORAL | 0 refills | Status: DC

## 2019-03-24 NOTE — Progress Notes (Signed)
Ms Wozny presents from ER yesterday for wound infection. Pt is S/P supracervical hyst by Dr Elonda Husky 4 weeks ago. Started noted some pain and discomfort along left edge of incision a few days ago. Drainage noted yesterday. Seen in ER and started on Doxycycline. Has not picked up Rx yet. DM, and reports CBG's elevated. Denies fever of chills. No bowel or bladder dysfunction  PE AF VSS Lungs clear Heart RRR Abd soft + BS incision well healed except for q tip opening left conner, probed with q tip to 3 cm, no purulent discharge, some redness noted as well  A/P Wound infection  Will change antibiotics to Augmentin. General wound care instructions reviewed with pt. Stressed importance of glycemic control. Pt has follow up appt this Friday with Dr.Eure. Will eval response to Tx at that time.

## 2019-03-24 NOTE — Discharge Instructions (Signed)
Use warm compresses and take the antibiotics as prescribed.  Call Dr. Elonda Husky in the morning to schedule an appointment for him to look at your incision.  Return to the ED with worsening pain, fever, vomiting, or other concerns.

## 2019-03-24 NOTE — Patient Instructions (Signed)
Wound Care, Adult Taking care of your wound properly can help to prevent pain, infection, and scarring. It can also help your wound to heal more quickly. How to care for your wound Wound care      Follow instructions from your health care provider about how to take care of your wound. Make sure you: ? Wash your hands with soap and water before you change the bandage (dressing). If soap and water are not available, use hand sanitizer. ? Change your dressing as told by your health care provider. ? Leave stitches (sutures), skin glue, or adhesive strips in place. These skin closures may need to stay in place for 2 weeks or longer. If adhesive strip edges start to loosen and curl up, you may trim the loose edges. Do not remove adhesive strips completely unless your health care provider tells you to do that.  Check your wound area every day for signs of infection. Check for: ? Redness, swelling, or pain. ? Fluid or blood. ? Warmth. ? Pus or a bad smell.  Ask your health care provider if you should clean the wound with mild soap and water. Doing this may include: ? Using a clean towel to pat the wound dry after cleaning it. Do not rub or scrub the wound. ? Applying a cream or ointment. Do this only as told by your health care provider. ? Covering the incision with a clean dressing.  Ask your health care provider when you can leave the wound uncovered.  Keep the dressing dry until your health care provider says it can be removed. Do not take baths, swim, use a hot tub, or do anything that would put the wound underwater until your health care provider approves. Ask your health care provider if you can take showers. You may only be allowed to take sponge baths. Medicines   If you were prescribed an antibiotic medicine, cream, or ointment, take or use the antibiotic as told by your health care provider. Do not stop taking or using the antibiotic even if your condition improves.  Take  over-the-counter and prescription medicines only as told by your health care provider. If you were prescribed pain medicine, take it 30 or more minutes before you do any wound care or as told by your health care provider. General instructions  Return to your normal activities as told by your health care provider. Ask your health care provider what activities are safe.  Do not scratch or pick at the wound.  Do not use any products that contain nicotine or tobacco, such as cigarettes and e-cigarettes. These may delay wound healing. If you need help quitting, ask your health care provider.  Keep all follow-up visits as told by your health care provider. This is important.  Eat a diet that includes protein, vitamin A, vitamin C, and other nutrient-rich foods to help the wound heal. ? Foods rich in protein include meat, dairy, beans, nuts, and other sources. ? Foods rich in vitamin A include carrots and dark green, leafy vegetables. ? Foods rich in vitamin C include citrus, tomatoes, and other fruits and vegetables. ? Nutrient-rich foods have protein, carbohydrates, fat, vitamins, or minerals. Eat a variety of healthy foods including vegetables, fruits, and whole grains. Contact a health care provider if:  You received a tetanus shot and you have swelling, severe pain, redness, or bleeding at the injection site.  Your pain is not controlled with medicine.  You have redness, swelling, or pain around the wound.    You have fluid or blood coming from the wound.  Your wound feels warm to the touch.  You have pus or a bad smell coming from the wound.  You have a fever or chills.  You are nauseous or you vomit.  You are dizzy. Get help right away if:  You have a red streak going away from your wound.  The edges of the wound open up and separate.  Your wound is bleeding, and the bleeding does not stop with gentle pressure.  You have a rash.  You faint.  You have trouble breathing.  Summary  Always wash your hands with soap and water before changing your bandage (dressing).  To help with healing, eat foods that are rich in protein, vitamin A, vitamin C, and other nutrients.  Check your wound every day for signs of infection. Contact your health care provider if you suspect that your wound is infected. This information is not intended to replace advice given to you by your health care provider. Make sure you discuss any questions you have with your health care provider. Document Released: 03/14/2008 Document Revised: 09/23/2018 Document Reviewed: 12/21/2015 Elsevier Patient Education  2020 [employer]  

## 2019-03-28 ENCOUNTER — Ambulatory Visit (INDEPENDENT_AMBULATORY_CARE_PROVIDER_SITE_OTHER): Payer: BLUE CROSS/BLUE SHIELD | Admitting: Obstetrics & Gynecology

## 2019-03-28 ENCOUNTER — Encounter: Payer: Self-pay | Admitting: Obstetrics & Gynecology

## 2019-03-28 ENCOUNTER — Other Ambulatory Visit: Payer: Self-pay

## 2019-03-28 VITALS — BP 137/82 | HR 107 | Ht 69.2 in | Wt 314.0 lb

## 2019-03-28 DIAGNOSIS — T8149XA Infection following a procedure, other surgical site, initial encounter: Secondary | ICD-10-CM

## 2019-03-28 DIAGNOSIS — Z90710 Acquired absence of both cervix and uterus: Secondary | ICD-10-CM

## 2019-03-28 MED ORDER — SILVER SULFADIAZINE 1 % EX CREA: TOPICAL_CREAM | CUTANEOUS | 11 refills | Status: DC

## 2019-03-28 NOTE — Progress Notes (Signed)
  HPI: Patient returns for routine postoperative follow-up having undergone supracervicl hysterectomy 9/2 on .  The patient's immediate postoperative recovery has been unremarkable. Since hospital discharge the patient reports cellulitis follow up.   Current Outpatient Medications: albuterol (PROAIR HFA) 108 (90 Base) MCG/ACT inhaler, Inhale 2 puffs into the lungs every 6 (six) hours as needed for wheezing or shortness of breath. , Disp: , Rfl:  ALPRAZolam (XANAX) 0.5 MG tablet, TAKE 1 TABLET BY MOUTH THREE TIMES A DAY AS NEEDED FOR ANXIETY (Patient taking differently: Take 0.5 mg by mouth 3 (three) times daily as needed for anxiety. ), Disp: 60 tablet, Rfl: 3 amoxicillin-clavulanate (AUGMENTIN) 875-125 MG tablet, Take 1 tablet by mouth 2 (two) times daily., Disp: 20 tablet, Rfl: 0 ARMOUR THYROID 60 MG tablet, Take 60 mg by mouth daily before breakfast., Disp: , Rfl:  cyclobenzaprine (FLEXERIL) 10 MG tablet, Take 1 tablet (10 mg total) by mouth every 8 (eight) hours as needed for muscle spasms., Disp: 30 tablet, Rfl: 1 DULoxetine (CYMBALTA) 60 MG capsule, Take 120 mg by mouth at bedtime., Disp: , Rfl:  fexofenadine (ALLEGRA) 180 MG tablet, Take 180 mg by mouth daily., Disp: , Rfl:  gabapentin (NEURONTIN) 300 MG capsule, Take 300 mg by mouth at bedtime. , Disp: , Rfl:  hydrochlorothiazide (HYDRODIURIL) 25 MG tablet, Take 1 tablet (25 mg total) by mouth daily., Disp: 90 tablet, Rfl: 2 hydrOXYzine (ATARAX/VISTARIL) 10 MG tablet, Take 1 tablet (10 mg total) by mouth 3 (three) times daily as needed., Disp: 30 tablet, Rfl: 1 lisdexamfetamine (VYVANSE) 70 MG capsule, Take 70 mg by mouth daily. , Disp: , Rfl:  lisinopril (PRINIVIL,ZESTRIL) 10 MG tablet, Take 1 tablet (10 mg total) by mouth daily. (Patient taking differently: Take 20 mg by mouth daily. ), Disp: 90 tablet, Rfl: 3 meclizine (ANTIVERT) 25 MG tablet, Take 25 mg by mouth 3 (three) times daily as needed for dizziness or nausea. , Disp: , Rfl:   Melatonin 5 MG TABS, Take 5 mg by mouth at bedtime as needed (sleep). , Disp: , Rfl:  meloxicam (MOBIC) 15 MG tablet, Take 15 mg by mouth daily. , Disp: , Rfl:  metFORMIN (GLUCOPHAGE) 500 MG tablet, Take 1 tablet (500 mg total) by mouth 2 (two) times daily., Disp: 180 tablet, Rfl: 3 montelukast (SINGULAIR) 10 MG tablet, Take 10 mg by mouth at bedtime. , Disp: , Rfl:  silver sulfADIAZINE (SILVADENE) 1 % cream, Apply 2-3 times daily, Disp: 50 g, Rfl: 11  No current facility-administered medications for this visit.     Blood pressure 137/82, pulse (!) 107, height 5' 9.2" (1.758 m), weight (!) 314 lb (142.4 kg), last menstrual period 06/01/2017.  Physical Exam: Mild left corner cellulitis  Diagnostic Tests:   Pathology:   Impression:   ICD-10-CM   1. S/P hysterectomy  Z90.710   2. Cellulitis of incision, delayed, 5 weeks post op  T81.49XA    Most likely staph related to corner of subcuticular suturing with vicryl in a diabetic patient     Plan:   Follow up: 1  weeks  Florian Buff, MD

## 2019-04-04 ENCOUNTER — Ambulatory Visit (INDEPENDENT_AMBULATORY_CARE_PROVIDER_SITE_OTHER): Payer: BLUE CROSS/BLUE SHIELD | Admitting: Obstetrics & Gynecology

## 2019-04-04 ENCOUNTER — Encounter: Payer: Self-pay | Admitting: Obstetrics & Gynecology

## 2019-04-04 ENCOUNTER — Other Ambulatory Visit: Payer: Self-pay

## 2019-04-04 VITALS — BP 182/105 | HR 108 | Ht 69.5 in | Wt 309.0 lb

## 2019-04-04 DIAGNOSIS — T8149XA Infection following a procedure, other surgical site, initial encounter: Secondary | ICD-10-CM

## 2019-04-04 DIAGNOSIS — Z90710 Acquired absence of both cervix and uterus: Secondary | ICD-10-CM

## 2019-04-04 NOTE — Progress Notes (Signed)
  HPI: Patient returns for routine postoperative follow-up having undergone abdominal supracervical hysterectomy  on 02/19/2019.  The patient's immediate postoperative recovery has been unremarkable. Since hospital discharge the patient reports improved.   Current Outpatient Medications: albuterol (PROAIR HFA) 108 (90 Base) MCG/ACT inhaler, Inhale 2 puffs into the lungs every 6 (six) hours as needed for wheezing or shortness of breath. , Disp: , Rfl:  ALPRAZolam (XANAX) 0.5 MG tablet, TAKE 1 TABLET BY MOUTH THREE TIMES A DAY AS NEEDED FOR ANXIETY (Patient taking differently: Take 0.5 mg by mouth 3 (three) times daily as needed for anxiety. ), Disp: 60 tablet, Rfl: 3 ARMOUR THYROID 60 MG tablet, Take 60 mg by mouth daily before breakfast., Disp: , Rfl:  cyclobenzaprine (FLEXERIL) 10 MG tablet, Take 1 tablet (10 mg total) by mouth every 8 (eight) hours as needed for muscle spasms., Disp: 30 tablet, Rfl: 1 DULoxetine (CYMBALTA) 60 MG capsule, Take 120 mg by mouth at bedtime., Disp: , Rfl:  fexofenadine (ALLEGRA) 180 MG tablet, Take 180 mg by mouth daily., Disp: , Rfl:  hydrochlorothiazide (HYDRODIURIL) 25 MG tablet, Take 1 tablet (25 mg total) by mouth daily., Disp: 90 tablet, Rfl: 2 hydrOXYzine (ATARAX/VISTARIL) 10 MG tablet, Take 1 tablet (10 mg total) by mouth 3 (three) times daily as needed., Disp: 30 tablet, Rfl: 1 lisinopril (PRINIVIL,ZESTRIL) 10 MG tablet, Take 1 tablet (10 mg total) by mouth daily. (Patient taking differently: Take 20 mg by mouth daily. ), Disp: 90 tablet, Rfl: 3 meclizine (ANTIVERT) 25 MG tablet, Take 25 mg by mouth 3 (three) times daily as needed for dizziness or nausea. , Disp: , Rfl:  Melatonin 5 MG TABS, Take 5 mg by mouth at bedtime as needed (sleep). , Disp: , Rfl:  meloxicam (MOBIC) 15 MG tablet, Take 15 mg by mouth daily. , Disp: , Rfl:  metFORMIN (GLUCOPHAGE) 500 MG tablet, Take 1 tablet (500 mg total) by mouth 2 (two) times daily., Disp: 180 tablet, Rfl:  3 montelukast (SINGULAIR) 10 MG tablet, Take 10 mg by mouth at bedtime. , Disp: , Rfl:  silver sulfADIAZINE (SILVADENE) 1 % cream, Apply 2-3 times daily, Disp: 50 g, Rfl: 11 amoxicillin-clavulanate (AUGMENTIN) 875-125 MG tablet, Take 1 tablet by mouth 2 (two) times daily. (Patient not taking: Reported on 04/04/2019), Disp: 20 tablet, Rfl: 0 gabapentin (NEURONTIN) 300 MG capsule, Take 300 mg by mouth at bedtime. , Disp: , Rfl:  lisdexamfetamine (VYVANSE) 70 MG capsule, Take 70 mg by mouth daily. , Disp: , Rfl:   No current facility-administered medications for this visit.     Blood pressure (!) 182/105, pulse (!) 108, height 5' 9.5" (1.765 m), weight (!) 309 lb (140.2 kg), last menstrual period 06/01/2017.  Physical Exam: Incision cellulitis has resolved corner of incisio is still open with minimal drainage Continues to use silvadene Painted lightly with GV Vaginal exam normal cervix intact  Diagnostic Tests:   Pathology: benign  Impression: S/p abdominal supracervical hysterectomy Post op cellulitis, delayed, related to staph probably with sutrue know in corner, now resolved  Plan: Cont silvaden for a week or so Keep dry intact  Follow up: 1  years  Florian Buff, MD

## 2019-04-21 ENCOUNTER — Other Ambulatory Visit: Payer: Self-pay | Admitting: Women's Health

## 2019-04-21 ENCOUNTER — Other Ambulatory Visit: Payer: Self-pay | Admitting: Obstetrics & Gynecology

## 2019-05-22 ENCOUNTER — Other Ambulatory Visit: Payer: Self-pay | Admitting: Adult Health

## 2019-05-22 MED ORDER — HYDROXYZINE HCL 10 MG PO TABS: 10.0000 mg | ORAL_TABLET | Freq: Three times a day (TID) | ORAL | 1 refills | Status: DC | PRN

## 2019-05-22 NOTE — Progress Notes (Signed)
Refill vistaril 

## 2019-06-15 ENCOUNTER — Other Ambulatory Visit: Payer: Self-pay | Admitting: Adult Health

## 2019-09-12 ENCOUNTER — Ambulatory Visit: Payer: BLUE CROSS/BLUE SHIELD | Admitting: Adult Health

## 2019-11-27 ENCOUNTER — Other Ambulatory Visit: Payer: Self-pay | Admitting: Cardiology

## 2019-11-27 DIAGNOSIS — R0602 Shortness of breath: Secondary | ICD-10-CM

## 2019-11-27 DIAGNOSIS — R9431 Abnormal electrocardiogram [ECG] [EKG]: Secondary | ICD-10-CM

## 2019-11-27 DIAGNOSIS — R002 Palpitations: Secondary | ICD-10-CM

## 2019-11-27 DIAGNOSIS — R079 Chest pain, unspecified: Secondary | ICD-10-CM

## 2019-12-12 ENCOUNTER — Ambulatory Visit: Admission: RE | Admit: 2019-12-12 | Payer: BLUE CROSS/BLUE SHIELD | Source: Ambulatory Visit

## 2019-12-16 ENCOUNTER — Ambulatory Visit: Admission: RE | Admit: 2019-12-16 | Payer: BLUE CROSS/BLUE SHIELD | Source: Ambulatory Visit

## 2019-12-17 ENCOUNTER — Encounter: Payer: BLUE CROSS/BLUE SHIELD | Attending: Cardiology

## 2020-02-03 ENCOUNTER — Other Ambulatory Visit: Payer: Self-pay | Admitting: Obstetrics & Gynecology

## 2020-02-22 ENCOUNTER — Other Ambulatory Visit: Payer: Self-pay | Admitting: Obstetrics & Gynecology

## 2020-05-01 ENCOUNTER — Other Ambulatory Visit: Payer: Self-pay | Admitting: Adult Health

## 2020-09-08 ENCOUNTER — Other Ambulatory Visit: Payer: Self-pay | Admitting: Adult Health

## 2021-03-23 ENCOUNTER — Other Ambulatory Visit: Payer: Self-pay | Admitting: Adult Health

## 2021-04-14 ENCOUNTER — Other Ambulatory Visit: Payer: Self-pay | Admitting: Adult Health

## 2021-07-28 ENCOUNTER — Other Ambulatory Visit: Payer: Self-pay | Admitting: Chiropractic Medicine

## 2021-07-28 ENCOUNTER — Ambulatory Visit
Admission: RE | Admit: 2021-07-28 | Discharge: 2021-07-28 | Disposition: A | Discharge status: Home / Self Care | Payer: Self-pay | Attending: Chiropractic Medicine | Admitting: Chiropractic Medicine

## 2021-07-28 ENCOUNTER — Ambulatory Visit
Admission: RE | Admit: 2021-07-28 | Discharge: 2021-07-28 | Disposition: A | Discharge status: Home / Self Care | Payer: Self-pay | Source: Ambulatory Visit | Attending: Chiropractic Medicine | Admitting: Chiropractic Medicine

## 2021-07-28 DIAGNOSIS — R0789 Other chest pain: Secondary | ICD-10-CM

## 2021-07-28 DIAGNOSIS — M9902 Segmental and somatic dysfunction of thoracic region: Secondary | ICD-10-CM | POA: Insufficient documentation

## 2021-07-28 DIAGNOSIS — M25552 Pain in left hip: Secondary | ICD-10-CM

## 2021-07-28 DIAGNOSIS — M25551 Pain in right hip: Secondary | ICD-10-CM

## 2022-04-03 ENCOUNTER — Other Ambulatory Visit: Payer: Self-pay | Admitting: Adult Health

## 2022-04-21 DIAGNOSIS — Z13220 Encounter for screening for lipoid disorders: Secondary | ICD-10-CM | POA: Diagnosis not present

## 2022-04-21 DIAGNOSIS — I152 Hypertension secondary to endocrine disorders: Secondary | ICD-10-CM | POA: Diagnosis not present

## 2022-04-21 DIAGNOSIS — E1159 Type 2 diabetes mellitus with other circulatory complications: Secondary | ICD-10-CM | POA: Diagnosis not present

## 2022-04-21 DIAGNOSIS — Z79899 Other long term (current) drug therapy: Secondary | ICD-10-CM | POA: Diagnosis not present

## 2022-04-21 DIAGNOSIS — E1165 Type 2 diabetes mellitus with hyperglycemia: Secondary | ICD-10-CM | POA: Diagnosis not present

## 2022-04-21 DIAGNOSIS — M352 Behcet's disease: Secondary | ICD-10-CM | POA: Diagnosis not present

## 2022-05-04 ENCOUNTER — Other Ambulatory Visit: Payer: Self-pay | Admitting: Adult Health

## 2022-05-04 DIAGNOSIS — F909 Attention-deficit hyperactivity disorder, unspecified type: Secondary | ICD-10-CM | POA: Diagnosis not present

## 2022-05-10 DIAGNOSIS — R109 Unspecified abdominal pain: Secondary | ICD-10-CM | POA: Diagnosis not present

## 2022-05-10 DIAGNOSIS — K219 Gastro-esophageal reflux disease without esophagitis: Secondary | ICD-10-CM | POA: Diagnosis not present

## 2022-05-29 DIAGNOSIS — L121 Cicatricial pemphigoid: Secondary | ICD-10-CM | POA: Diagnosis not present

## 2022-05-29 DIAGNOSIS — L719 Rosacea, unspecified: Secondary | ICD-10-CM | POA: Diagnosis not present

## 2022-05-29 DIAGNOSIS — L710 Perioral dermatitis: Secondary | ICD-10-CM | POA: Diagnosis not present

## 2022-06-01 DIAGNOSIS — F909 Attention-deficit hyperactivity disorder, unspecified type: Secondary | ICD-10-CM | POA: Diagnosis not present

## 2022-08-01 DIAGNOSIS — L121 Cicatricial pemphigoid: Secondary | ICD-10-CM | POA: Diagnosis not present

## 2022-08-01 DIAGNOSIS — K1379 Other lesions of oral mucosa: Secondary | ICD-10-CM | POA: Diagnosis not present

## 2022-08-01 DIAGNOSIS — Z79621 Long term (current) use of calcineurin inhibitor: Secondary | ICD-10-CM | POA: Diagnosis not present

## 2022-08-01 DIAGNOSIS — Z7969 Long term (current) use of other immunomodulators and immunosuppressants: Secondary | ICD-10-CM | POA: Diagnosis not present

## 2022-08-25 ENCOUNTER — Ambulatory Visit: Payer: BC Managed Care – PPO | Admitting: Adult Health

## 2022-08-25 ENCOUNTER — Encounter: Payer: Self-pay | Admitting: Adult Health

## 2022-08-25 ENCOUNTER — Other Ambulatory Visit (HOSPITAL_COMMUNITY)
Admission: RE | Admit: 2022-08-25 | Discharge: 2022-08-25 | Disposition: A | Discharge status: Home / Self Care | Payer: BC Managed Care – PPO | Source: Ambulatory Visit | Attending: Adult Health | Admitting: Adult Health

## 2022-08-25 VITALS — BP 129/81 | HR 67 | Ht 69.0 in | Wt 257.5 lb

## 2022-08-25 DIAGNOSIS — N888 Other specified noninflammatory disorders of cervix uteri: Secondary | ICD-10-CM | POA: Diagnosis not present

## 2022-08-25 DIAGNOSIS — Z90711 Acquired absence of uterus with remaining cervical stump: Secondary | ICD-10-CM

## 2022-08-25 DIAGNOSIS — Z124 Encounter for screening for malignant neoplasm of cervix: Secondary | ICD-10-CM | POA: Insufficient documentation

## 2022-08-25 DIAGNOSIS — N939 Abnormal uterine and vaginal bleeding, unspecified: Secondary | ICD-10-CM | POA: Insufficient documentation

## 2022-08-25 MED ORDER — METRONIDAZOLE 0.75 % VA GEL: 1.0000 | Freq: Every day | VAGINAL | 2 refills | Status: DC

## 2022-08-25 NOTE — Progress Notes (Signed)
Subjective:     Patient ID: Krista [name], female   DOB: May 02, 1976, 47 y.o.   MRN: [id number]  HPI Krista [name] is a 47 year old white female,married, sp [facility] in complaining of vaginal bleeding for about 6 days, not heavy.  Had had sex several days before, no pain. She needs pap.  PCP is Krista [name], [place].  Review of Systems +vaginal bleeding No pain Reviewed past medical,surgical, social and family history. Reviewed medications and allergies.     Objective:   Physical Exam BP 129/81 (BP Location: Right Arm, Patient Position: Sitting, Cuff Size: Large)   Pulse 67   Ht '5\' 9"'$  (1.753 m)   Wt 257 lb 8 oz (116.8 kg)   LMP 06/01/2017   BMI 38.03 kg/m     Skin warm and dry.Pelvic: external genitalia is normal in appearance no lesions, vagina: pink and moist,urethra has no lesions or masses noted, cervix: bulbous, has some eversion and friable with EC brush, pap with HR HPV genotyping performed,  uterus is absent, adnexa: no masses or tenderness noted. Bladder is non tender and no masses felt.  AA is 1 Fall risk is low    08/25/2022    9:19 AM 01/06/2019    2:17 PM 12/26/2018    1:49 PM  Depression screen PHQ 2/9  Decreased Interest '1 1 1  '$ Down, Depressed, Hopeless '1 1 1  '$ PHQ - 2 Score '2 2 2  '$ Altered sleeping '1 2 2  '$ Tired, decreased energy '1 2 3  '$ Change in appetite '1 2 2  '$ Feeling bad or failure about yourself  '2 2 1  '$ Trouble concentrating 2 0 1  Moving slowly or fidgety/restless 1 0 0  Suicidal thoughts 0 0 0  PHQ-9 Score '10 10 11  '$ Difficult doing work/chores   Somewhat difficult   She is on meds     08/25/2022    9:19 AM  GAD 7 : Generalized Anxiety Score  Nervous, Anxious, on Edge 2  Control/stop worrying 1  Worry too much - different things 1  Trouble relaxing 2  Restless 0  Easily annoyed or irritable 2  Afraid - awful might happen 2  Total GAD 7 Score 10      Upstream - 08/25/22 0933       Pregnancy Intention Screening   Does the patient want to become pregnant  in the next year? N/A    Does the patient's partner want to become pregnant in the next year? N/A    Would the patient like to discuss contraceptive options today? N/A      Contraception Wrap Up   Current Method Female Sterilization   [facility]   End Method Female Sterilization   [facility]   Contraception Counseling Provided No              Examination chaperoned by Levy Pupa LPN  Assessment:     1. Routine Papanicolaou smear Pap sent Pap in 3 years if normal Get physical and labs with PCP  2. Vaginal bleeding Has had bleeding about 6 days No pain and not heavy  3. S/P laparoscopic supracervical hysterectomy  4. Friable cervix Cervix is friable Will rex Metrogel Meds ordered this encounter  Medications   metroNIDAZOLE (METROGEL) 0.75 % vaginal gel    Sig: Place 1 Applicatorful vaginally at bedtime. Use for 5 nights then prn after sex    Dispense:  70 g    Refill:  2    Order Specific Question:   Supervising  Provider    Answer:   Florian Buff [[id number]]       Plan:     Return in 3 years for pap if normal and prn

## 2022-08-30 LAB — CYTOLOGY - PAP
Comment: NEGATIVE
Diagnosis: NEGATIVE
High risk HPV: NEGATIVE

## 2022-09-18 DIAGNOSIS — L121 Cicatricial pemphigoid: Secondary | ICD-10-CM | POA: Diagnosis not present

## 2022-12-13 DIAGNOSIS — F331 Major depressive disorder, recurrent, moderate: Secondary | ICD-10-CM | POA: Diagnosis not present

## 2022-12-13 DIAGNOSIS — Z13220 Encounter for screening for lipoid disorders: Secondary | ICD-10-CM | POA: Diagnosis not present

## 2022-12-13 DIAGNOSIS — E1159 Type 2 diabetes mellitus with other circulatory complications: Secondary | ICD-10-CM | POA: Diagnosis not present

## 2022-12-13 DIAGNOSIS — E1165 Type 2 diabetes mellitus with hyperglycemia: Secondary | ICD-10-CM | POA: Diagnosis not present

## 2022-12-13 DIAGNOSIS — R61 Generalized hyperhidrosis: Secondary | ICD-10-CM | POA: Diagnosis not present

## 2022-12-13 DIAGNOSIS — I152 Hypertension secondary to endocrine disorders: Secondary | ICD-10-CM | POA: Diagnosis not present

## 2022-12-13 DIAGNOSIS — M2550 Pain in unspecified joint: Secondary | ICD-10-CM | POA: Diagnosis not present

## 2022-12-13 DIAGNOSIS — D84821 Immunodeficiency due to drugs: Secondary | ICD-10-CM | POA: Diagnosis not present

## 2022-12-13 DIAGNOSIS — Z0000 Encounter for general adult medical examination without abnormal findings: Secondary | ICD-10-CM | POA: Diagnosis not present

## 2022-12-18 DIAGNOSIS — R7989 Other specified abnormal findings of blood chemistry: Secondary | ICD-10-CM | POA: Diagnosis not present

## 2022-12-18 DIAGNOSIS — R509 Fever, unspecified: Secondary | ICD-10-CM | POA: Diagnosis not present

## 2022-12-18 DIAGNOSIS — J029 Acute pharyngitis, unspecified: Secondary | ICD-10-CM | POA: Diagnosis not present

## 2022-12-18 DIAGNOSIS — Z79899 Other long term (current) drug therapy: Secondary | ICD-10-CM | POA: Diagnosis not present

## 2022-12-18 DIAGNOSIS — I10 Essential (primary) hypertension: Secondary | ICD-10-CM | POA: Diagnosis not present

## 2022-12-18 DIAGNOSIS — M7918 Myalgia, other site: Secondary | ICD-10-CM | POA: Diagnosis not present

## 2022-12-19 DIAGNOSIS — Z5181 Encounter for therapeutic drug level monitoring: Secondary | ICD-10-CM | POA: Diagnosis not present

## 2022-12-19 DIAGNOSIS — L121 Cicatricial pemphigoid: Secondary | ICD-10-CM | POA: Diagnosis not present

## 2022-12-20 DIAGNOSIS — R61 Generalized hyperhidrosis: Secondary | ICD-10-CM | POA: Diagnosis not present

## 2022-12-20 DIAGNOSIS — D72819 Decreased white blood cell count, unspecified: Secondary | ICD-10-CM | POA: Diagnosis not present

## 2022-12-20 DIAGNOSIS — I10 Essential (primary) hypertension: Secondary | ICD-10-CM | POA: Diagnosis not present

## 2022-12-20 DIAGNOSIS — D61818 Other pancytopenia: Secondary | ICD-10-CM | POA: Diagnosis not present

## 2022-12-20 DIAGNOSIS — R7989 Other specified abnormal findings of blood chemistry: Secondary | ICD-10-CM | POA: Diagnosis not present

## 2022-12-20 DIAGNOSIS — R509 Fever, unspecified: Secondary | ICD-10-CM | POA: Diagnosis not present

## 2022-12-20 DIAGNOSIS — R634 Abnormal weight loss: Secondary | ICD-10-CM | POA: Diagnosis not present

## 2022-12-20 DIAGNOSIS — Z79899 Other long term (current) drug therapy: Secondary | ICD-10-CM | POA: Diagnosis not present

## 2022-12-26 DIAGNOSIS — D61818 Other pancytopenia: Secondary | ICD-10-CM | POA: Diagnosis not present

## 2022-12-26 DIAGNOSIS — R61 Generalized hyperhidrosis: Secondary | ICD-10-CM | POA: Diagnosis not present

## 2022-12-26 DIAGNOSIS — R634 Abnormal weight loss: Secondary | ICD-10-CM | POA: Diagnosis not present

## 2022-12-26 DIAGNOSIS — R162 Hepatomegaly with splenomegaly, not elsewhere classified: Secondary | ICD-10-CM | POA: Diagnosis not present

## 2022-12-26 DIAGNOSIS — R188 Other ascites: Secondary | ICD-10-CM | POA: Diagnosis not present

## 2022-12-26 DIAGNOSIS — R7989 Other specified abnormal findings of blood chemistry: Secondary | ICD-10-CM | POA: Diagnosis not present

## 2022-12-26 DIAGNOSIS — R5383 Other fatigue: Secondary | ICD-10-CM | POA: Diagnosis not present

## 2022-12-26 DIAGNOSIS — R911 Solitary pulmonary nodule: Secondary | ICD-10-CM | POA: Diagnosis not present

## 2023-01-03 DIAGNOSIS — D61818 Other pancytopenia: Secondary | ICD-10-CM | POA: Diagnosis not present

## 2023-01-03 DIAGNOSIS — R590 Localized enlarged lymph nodes: Secondary | ICD-10-CM | POA: Diagnosis not present

## 2023-01-03 DIAGNOSIS — I899 Noninfective disorder of lymphatic vessels and lymph nodes, unspecified: Secondary | ICD-10-CM | POA: Diagnosis not present

## 2023-01-05 ENCOUNTER — Emergency Department: Payer: BC Managed Care – PPO

## 2023-01-05 ENCOUNTER — Emergency Department
Admission: EM | Admit: 2023-01-05 | Discharge: 2023-01-05 | Disposition: A | Discharge status: Home / Self Care | Payer: BC Managed Care – PPO | Attending: Emergency Medicine | Admitting: Emergency Medicine

## 2023-01-05 ENCOUNTER — Other Ambulatory Visit: Payer: Self-pay

## 2023-01-05 DIAGNOSIS — E039 Hypothyroidism, unspecified: Secondary | ICD-10-CM | POA: Diagnosis not present

## 2023-01-05 DIAGNOSIS — R0609 Other forms of dyspnea: Secondary | ICD-10-CM

## 2023-01-05 DIAGNOSIS — J810 Acute pulmonary edema: Secondary | ICD-10-CM | POA: Insufficient documentation

## 2023-01-05 DIAGNOSIS — E119 Type 2 diabetes mellitus without complications: Secondary | ICD-10-CM | POA: Diagnosis not present

## 2023-01-05 DIAGNOSIS — R002 Palpitations: Secondary | ICD-10-CM | POA: Diagnosis not present

## 2023-01-05 DIAGNOSIS — M79605 Pain in left leg: Secondary | ICD-10-CM | POA: Diagnosis not present

## 2023-01-05 DIAGNOSIS — D649 Anemia, unspecified: Secondary | ICD-10-CM | POA: Diagnosis not present

## 2023-01-05 DIAGNOSIS — J90 Pleural effusion, not elsewhere classified: Secondary | ICD-10-CM | POA: Diagnosis not present

## 2023-01-05 DIAGNOSIS — J9811 Atelectasis: Secondary | ICD-10-CM | POA: Diagnosis not present

## 2023-01-05 DIAGNOSIS — M7989 Other specified soft tissue disorders: Secondary | ICD-10-CM | POA: Diagnosis not present

## 2023-01-05 DIAGNOSIS — I10 Essential (primary) hypertension: Secondary | ICD-10-CM | POA: Diagnosis not present

## 2023-01-05 DIAGNOSIS — R0602 Shortness of breath: Secondary | ICD-10-CM | POA: Diagnosis not present

## 2023-01-05 DIAGNOSIS — R590 Localized enlarged lymph nodes: Secondary | ICD-10-CM | POA: Diagnosis not present

## 2023-01-05 DIAGNOSIS — M79604 Pain in right leg: Secondary | ICD-10-CM | POA: Diagnosis not present

## 2023-01-05 DIAGNOSIS — R059 Cough, unspecified: Secondary | ICD-10-CM | POA: Diagnosis not present

## 2023-01-05 DIAGNOSIS — J984 Other disorders of lung: Secondary | ICD-10-CM | POA: Diagnosis not present

## 2023-01-05 LAB — CBC WITH DIFFERENTIAL/PLATELET
Abs Immature Granulocytes: 0.03 10*3/uL (ref 0.00–0.07)
Basophils Absolute: 0 10*3/uL (ref 0.0–0.1)
Basophils Relative: 1 %
Eosinophils Absolute: 0.1 10*3/uL (ref 0.0–0.5)
Eosinophils Relative: 1 %
HCT: 31 % — ABNORMAL LOW (ref 36.0–46.0)
Hemoglobin: 9.7 g/dL — ABNORMAL LOW (ref 12.0–15.0)
Immature Granulocytes: 1 %
Lymphocytes Relative: 67 %
Lymphs Abs: 3.7 10*3/uL (ref 0.7–4.0)
MCH: 27 pg (ref 26.0–34.0)
MCHC: 31.3 g/dL (ref 30.0–36.0)
MCV: 86.4 fL (ref 80.0–100.0)
Monocytes Absolute: 0.4 10*3/uL (ref 0.1–1.0)
Monocytes Relative: 7 %
Neutro Abs: 1.2 10*3/uL — ABNORMAL LOW (ref 1.7–7.7)
Neutrophils Relative %: 23 %
Platelets: 289 10*3/uL (ref 150–400)
RBC: 3.59 MIL/uL — ABNORMAL LOW (ref 3.87–5.11)
RDW: 15.9 % — ABNORMAL HIGH (ref 11.5–15.5)
Smear Review: NORMAL
WBC: 5.4 10*3/uL (ref 4.0–10.5)
nRBC: 0 % (ref 0.0–0.2)

## 2023-01-05 LAB — BASIC METABOLIC PANEL
Anion gap: 8 (ref 5–15)
BUN: 15 mg/dL (ref 6–20)
CO2: 23 mmol/L (ref 22–32)
Calcium: 8.3 mg/dL — ABNORMAL LOW (ref 8.9–10.3)
Chloride: 110 mmol/L (ref 98–111)
Creatinine, Ser: 0.89 mg/dL (ref 0.44–1.00)
GFR, Estimated: >60 mL/min (ref >60)
Glucose, Bld: 128 mg/dL — ABNORMAL HIGH (ref 70–99)
Potassium: 3.8 mmol/L (ref 3.5–5.1)
Sodium: 141 mmol/L (ref 135–145)

## 2023-01-05 LAB — TROPONIN I (HIGH SENSITIVITY)
Troponin I (High Sensitivity): 5 ng/L (ref <18)
Troponin I (High Sensitivity): 6 ng/L (ref <18)

## 2023-01-05 MED ORDER — IOHEXOL 350 MG/ML SOLN
100.0000 mL | Freq: Once | INTRAVENOUS | Status: AC | PRN
  Administered 2023-01-05: 100 mL via INTRAVENOUS

## 2023-01-05 MED ORDER — FUROSEMIDE 20 MG PO TABS: 20.0000 mg | ORAL_TABLET | Freq: Every day | ORAL | 0 refills | Status: DC

## 2023-01-05 MED ORDER — FUROSEMIDE 10 MG/ML IJ SOLN
80.0000 mg | Freq: Once | INTRAMUSCULAR | Status: AC
  Administered 2023-01-05: 80 mg via INTRAVENOUS
  Filled 2023-01-05: qty 8

## 2023-01-05 NOTE — ED Notes (Signed)
Patient HR 109 and oxygen saturation 94% while walking.  Patient denies shob, chest pain and dizziness while walking.

## 2023-01-05 NOTE — ED Provider Notes (Signed)
[facility] Provider Note    Event Date/Time   First MD Initiated Contact with Patient 01/05/23 1807     (approximate)   History   Chief Complaint Shortness of Breath and Palpitations   HPI  Krista [name] is a 47 y.o. female with past medical history of hypertension, diabetes, and hypothyroidism who presents to the ED complaining of shortness of breath.  Patient reports that over the past week she has been dealing with increasing dyspnea on exertion and feeling like her heart is racing.  She reports minimal breathing difficulty at rest, does endorse a cough but denies any fevers.  She states that both of her legs have seemed swollen recently and are sore to touch, but she has not had any overlying redness or warmth.  She denies any history of DVT/PE, does state that about a month ago she went on an overnight car trip to [place] and back.  She also states that she has been dealing with night sweats and intermittent fevers as high as 100.3 for about the past month.  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  She has since stopped taking CellCept, is awaiting results of lymph node biopsy.     Physical Exam   Triage Vital Signs: ED Triage Vitals  Encounter Vitals Group     BP 01/05/23 1431 (!) 192/88     Systolic BP Percentile --      Diastolic BP Percentile --      Pulse Rate 01/05/23 1431 97     Resp 01/05/23 1431 20     Temp 01/05/23 1431 98.6 F (37 C)     Temp Source 01/05/23 1431 Oral     SpO2 01/05/23 1431 98 %     Weight 01/05/23 1432 260 lb (117.9 kg)     Height 01/05/23 1432 5\' 9"  (1.753 m)     Head Circumference --      Peak Flow --      Pain Score 01/05/23 1431 0     Pain Loc --      Pain Education --      Exclude from Growth Chart --     Most recent vital signs: Vitals:   01/05/23 2130 01/05/23 2200  BP: (!) 158/91 (!)  156/81  Pulse: 92 91  Resp: (!) 25 (!) 21  Temp:    SpO2: 100% 94%    Constitutional: Alert and oriented. Eyes: Conjunctivae are normal. Head: Atraumatic. Nose: No congestion/rhinnorhea. Mouth/Throat: Mucous membranes are moist.  Cardiovascular: Normal rate, regular rhythm. Grossly normal heart sounds.  2+ radial pulses bilaterally. Respiratory: Normal respiratory effort.  No retractions. Lungs CTAB. Gastrointestinal: Soft and nontender. No distention. Musculoskeletal: Trace pitting edema to bilateral lower extremities with mild calf tenderness, no overlying erythema or warmth. Neurologic:  Normal speech and language. No gross focal neurologic deficits are appreciated.    ED Results / Procedures / Treatments   Labs (all labs ordered are listed, but only abnormal results are displayed) Labs Reviewed  BASIC METABOLIC PANEL - Abnormal; Notable for the following components:      Result Value   Glucose, Bld 128 (*)    Calcium 8.3 (*)    All other components within normal limits  CBC WITH DIFFERENTIAL/PLATELET - Abnormal; Notable for the following components:   RBC 3.59 (*)    Hemoglobin 9.7 (*)    HCT 31.0 (*)  RDW 15.9 (*)    Neutro Abs 1.2 (*)    All other components within normal limits  TROPONIN I (HIGH SENSITIVITY)  TROPONIN I (HIGH SENSITIVITY)     EKG  ED ECG REPORT I, Chesley Noon, the attending physician, personally viewed and interpreted this ECG.   Date: 01/05/2023  EKG Time: 14:39  Rate: 91  Rhythm: normal sinus rhythm  Axis: Normal  Intervals:none  ST&T Change: None  RADIOLOGY Chest x-ray reviewed and interpreted by me with small bilateral effusions, no focal infiltrate or edema noted.  PROCEDURES:  Critical Care performed: No  Procedures   MEDICATIONS ORDERED IN ED: Medications  iohexol (OMNIPAQUE) 350 MG/ML injection 100 mL (100 mLs Intravenous Contrast Given 01/05/23 1953)  furosemide (LASIX) injection 80 mg (80 mg Intravenous Given  01/05/23 2047)     IMPRESSION / MDM / ASSESSMENT AND PLAN / ED COURSE  I reviewed the triage vital signs and the nursing notes.                              47 y.o. female with past medical history of hypertension, diabetes, and hypothyroidism who presents to the ED complaining of increasing dyspnea on exertion and palpitations for the past week.  Patient's presentation is most consistent with acute presentation with potential threat to life or bodily function.  Differential diagnosis includes, but is not limited to, arrhythmia, ACS, PE, pneumonia, pneumothorax, CHF, COPD, viral syndrome.  Patient nontoxic-appearing and in no acute distress, vital signs are unremarkable.  Patient is not in any respiratory distress, maintaining oxygen saturations at 99% on room air.  She does have some edema and tenderness to her lower extremities, no findings concerning for infection however and she remains neurovascular intact distally.  EKG shows no evidence of arrhythmia or ischemia and 2 sets of troponin are within normal limits, doubt ACS but we will further assess for PE with CTA of her chest.  We will also check lower extremity ultrasound.  She is slightly anemic but no significant leukocytosis, lecture abnormality, or AKI noted.  Previous pancytopenia has improved with normal WBC and platelet count.  She is afebrile and no findings concerning for sepsis.  CTA is negative for PE, does show pulmonary edema with small to moderate effusions bilaterally.  Patient diuresed with 80 mg IV Lasix, now feeling better with less difficulty breathing.  Repeat troponin within normal limits and I doubt ACS.  CT imaging does show lymphadenopathy similar to recent CT imaging performed in the [facility] system.  This has been previously worked up with biopsy, results currently pending.  Patient was ambulated in the ED with minimal difficulty breathing and no drop in oxygen saturations.  She is appropriate for outpatient management  and we will provide referral to cardiology as well as CHF clinic, start patient on short course of Lasix.  She was counseled to return to the ED for new or worsening symptoms, patient agrees with plan.      FINAL CLINICAL IMPRESSION(S) / ED DIAGNOSES   Final diagnoses:  Acute pulmonary edema (HCC)  Dyspnea on exertion     Rx / DC Orders   ED Discharge Orders          Ordered    furosemide (LASIX) 20 MG tablet  Daily        01/05/23 2243    Ambulatory referral to Cardiology        01/05/23 2243    AMB  referral to CHF clinic        01/05/23 2243             Note:  This document was prepared using Dragon voice recognition software and may include unintentional dictation errors.   Chesley Noon, MD 01/05/23 2246

## 2023-01-05 NOTE — ED Triage Notes (Signed)
Pt reports shortness of breath, palpitations and bilateral foot swelling x1 week. Pt also states she has had a fever intermittently for the past month. Pt has been seen by pcp and hematologist for fever and had blood work. Pt waiting on biopsy results from lymph node in groin that was done Wednesday.

## 2023-01-09 DIAGNOSIS — D508 Other iron deficiency anemias: Secondary | ICD-10-CM | POA: Diagnosis not present

## 2023-01-09 DIAGNOSIS — R591 Generalized enlarged lymph nodes: Secondary | ICD-10-CM | POA: Diagnosis not present

## 2023-01-09 DIAGNOSIS — D61818 Other pancytopenia: Secondary | ICD-10-CM | POA: Diagnosis not present

## 2023-01-22 ENCOUNTER — Encounter: Payer: BC Managed Care – PPO | Admitting: Family

## 2023-01-22 ENCOUNTER — Telehealth: Payer: Self-pay | Admitting: Family

## 2023-01-22 NOTE — Progress Notes (Deleted)
Advanced Heart Failure Clinic Note   Referring Physician: Chesley Noon, MD (EDP) PCP: Ladora Daniel, PA-C (last seen 07/24) PCP-Cardiologist: Marcina Millard, MD/ Leanora Ivanoff, [place] (last seen 06/23)  HPI:  Ms Krista [name] is a 47 y/o female with a history of  Was in the ED 01/05/23 due to increasing dyspnea on exertion and feeling like her heart is racing. Also had pedal edema and low grade fever. CTA is negative for PE, does show pulmonary edema with small to moderate effusions bilaterally. IV diuresed with improvement of symptoms. CT imaging does show lymphadenopathy similar to recent CT imaging performed in the [facility] system. This has been previously worked up with biopsy.   Echo 05/16/2005: EF 55-65% Echo 12/27/21: EF >55% with mild LVH  She presents today for her initial HF visit with a chief complaint of      Review of Systems: [y] = yes, [ ]  = no   General: Weight gain [ ] ; Weight loss [ ] ; Anorexia [ ] ; Fatigue [ ] ; Fever [ ] ; Chills [ ] ; Weakness [ ]   Cardiac: Chest pain/pressure [ ] ; Resting SOB [ ] ; Exertional SOB [ ] ; Orthopnea [ ] ; Pedal Edema [ ] ; Palpitations [ ] ; Syncope [ ] ; Presyncope [ ] ; Paroxysmal nocturnal dyspnea[ ]   Pulmonary: Cough [ ] ; Wheezing[ ] ; Hemoptysis[ ] ; Sputum [ ] ; Snoring [ ]   GI: Vomiting[ ] ; Dysphagia[ ] ; Melena[ ] ; Hematochezia [ ] ; Heartburn[ ] ; Abdominal pain [ ] ; Constipation [ ] ; Diarrhea [ ] ; BRBPR [ ]   GU: Hematuria[ ] ; Dysuria [ ] ; Nocturia[ ]   Vascular: Pain in legs with walking [ ] ; Pain in feet with lying flat [ ] ; Non-healing sores [ ] ; Stroke [ ] ; TIA [ ] ; Slurred speech [ ] ;  Neuro: Headaches[ ] ; Vertigo[ ] ; Seizures[ ] ; Paresthesias[ ] ;Blurred vision [ ] ; Diplopia [ ] ; Vision changes [ ]   Ortho/Skin: Arthritis [ ] ; Joint pain [ ] ; Muscle pain [ ] ; Joint swelling [ ] ; Back Pain [ ] ; Rash [ ]   Psych: Depression[ ] ; Anxiety[ ]   Heme: Bleeding problems [ ] ; Clotting disorders [ ] ; Anemia [ ]   Endocrine: Diabetes [ ] ; Thyroid  dysfunction[ ]    Past Medical History:  Diagnosis Date   Allergy    Anxiety    Arthritis    Asthma    Auto immune neutropenia (HCC)    Depression    Diabetes mellitus without complication (HCC)    Fibroids 02/13/2014   Friable cervix 03/15/2015   Hypertension    Hypothyroid    Irregular bleeding 03/15/2015   Lyme disease    Menometrorrhagia 02/11/2014   Obesity    Rosacea    Severe menstrual cramps 09/17/2015   Superficial fungus infection of skin 10/20/2015   Weight gain 08/31/2015    Current Outpatient Medications  Medication Sig Dispense Refill   albuterol (PROAIR HFA) 108 (90 Base) MCG/ACT inhaler Inhale 2 puffs into the lungs every 6 (six) hours as needed for wheezing or shortness of breath.      furosemide (LASIX) 20 MG tablet Take 1 tablet (20 mg total) by mouth daily for 10 days. 10 tablet 0   hydrOXYzine (ATARAX) 10 MG tablet TAKE 1 TABLET BY MOUTH THREE TIMES A DAY AS NEEDED 270 tablet 3   lisdexamfetamine (VYVANSE) 70 MG capsule Take 70 mg by mouth daily.      lisinopril (PRINIVIL,ZESTRIL) 10 MG tablet Take 1 tablet (10 mg total) by mouth daily. (Patient taking differently: Take 20 mg by mouth daily.) 90 tablet 3  metFORMIN (GLUCOPHAGE) 500 MG tablet Take 1 tablet (500 mg total) by mouth 2 (two) times daily. 180 tablet 3   metroNIDAZOLE (METROGEL) 0.75 % vaginal gel Place 1 Applicatorful vaginally at bedtime. Use for 5 nights then prn after sex 70 g 2   mycophenolate (CELLCEPT) 500 MG tablet Take by mouth.     tacrolimus (PROGRAF) 1 MG capsule Dissolve 1 capsule in of water. Use this solution to swish and spit twice daily. Store in the fridge for 1 week, then discard and make a new solution.     No current facility-administered medications for this visit.    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   Tobacco Use   Smoking status: Never   Smokeless tobacco: Never  Vaping Use   Vaping status: Never Used  Substance and Sexual Activity   Alcohol use: Not Currently    Alcohol/week: 0.0 standard drinks of alcohol    Comment: rarely   Drug use: No   Sexual activity: Yes    Birth control/protection: Surgical    Comment: supracervical hyst  Other Topics Concern   Not on file  Social History Narrative   Drinks one soda a day    Social Determinants of Health   Financial Resource Strain: Low Risk  (12/13/2022)   Received from [facility]   Overall Financial Resource Strain (CARDIA)    Difficulty of Paying Living Expenses: Not hard at all  Food Insecurity: No Food Insecurity (12/13/2022)   Received from [facility]   Hunger Vital Sign    Worried About Running Out of Food in the Last Year: Never true    Ran Out of Food in the Last Year: Never true  Transportation Needs: No Transportation Needs (12/13/2022)   Received [facility] - Transportation    Lack of Transportation (Medical): No    Lack of Transportation (Non-Medical): No  Physical Activity: Insufficiently Active (12/13/2022)   Received from [facility]   Exercise Vital Sign    Days of Exercise per Week: 3 days    Minutes of Exercise per Session: 30 min  Stress: No Stress Concern Present (12/13/2022)   Received from [facility] Occupational [facility] - Occupational Stress Questionnaire    Feeling of Stress : Not at all  Social Connections: Moderately Integrated (12/13/2022)   Received from [facility]   Social Network    How would you rate your social network (family, work, friends)?: Adequate participation with social networks  Intimate Partner Violence: Not At Risk (12/13/2022)   Received from [facility]   HITS    Over the last 12 months how often did your partner  physically hurt you?: 1    Over the last 12 months how often did your partner insult you or talk down to you?: 1    Over the last 12 months how often did your partner threaten you with physical harm?: 1    Over the last 12 months how often did your partner scream or curse at you?: 1      Family History  Problem Relation Age of Onset   Coronary artery disease Paternal  Grandfather    Coronary artery disease Paternal Grandmother    Diabetes Paternal Grandmother    Diabetes Maternal Grandmother    Coronary artery disease Maternal Grandmother    Diabetes Maternal Grandfather    Coronary artery disease Maternal Grandfather    Heart attack Father    Hypertension Father    Diabetes Mother    Hypertension Mother    Other Mother        MVA   Cancer Sister    Depression Sister        PHYSICAL EXAM: General:  Well appearing. No respiratory difficulty HEENT: normal Neck: supple. no JVD. Carotids 2+ bilat; no bruits. No lymphadenopathy or thyromegaly appreciated. Cor: PMI nondisplaced. Regular rate & rhythm. No rubs, gallops or murmurs. Lungs: clear Abdomen: soft, nontender, nondistended. No hepatosplenomegaly. No bruits or masses. Good bowel sounds. Extremities: no cyanosis, clubbing, rash, edema Neuro: alert & oriented x 3, cranial nerves grossly intact. moves all 4 extremities w/o difficulty. Affect pleasant.  ECG:   ASSESSMENT & PLAN:  1: Chronic heart failure with preserved ejection fraction- - suspect due to HTN - NYHA class - euvolemic - weighing daily - Echo 05/16/2005: EF 55-65% - Echo 12/27/21: EF >55% with mild LVH - saw cardiology Mellissa Kohut) 06/23 - continue   2: HTN- - BP - saw PCP Herold Harms) 07/24 - BMP 01/05/23 showed sodium 141, potassium 3.8, creatinine 0.89 & GFR >60  3: Generalized lymphadenopathy- - saw hem/onc Mechele Collin) 07/24 - needle biopsy was performed on 01/03/2023, revealing benign lymphoid tissue with fibrosis    Delma Freeze, FNP 01/22/23

## 2023-01-22 NOTE — Telephone Encounter (Signed)
Patient did not show for her initial Heart Failure Clinic appointment on 01/22/23.

## 2023-01-23 ENCOUNTER — Ambulatory Visit: Payer: BC Managed Care – PPO | Attending: Family | Admitting: Family

## 2023-01-23 ENCOUNTER — Encounter: Payer: Self-pay | Admitting: Family

## 2023-01-23 VITALS — BP 152/80 | HR 78 | Resp 14 | Wt 254.5 lb

## 2023-01-23 DIAGNOSIS — F419 Anxiety disorder, unspecified: Secondary | ICD-10-CM | POA: Diagnosis not present

## 2023-01-23 DIAGNOSIS — I428 Other cardiomyopathies: Secondary | ICD-10-CM | POA: Insufficient documentation

## 2023-01-23 DIAGNOSIS — I10 Essential (primary) hypertension: Secondary | ICD-10-CM

## 2023-01-23 DIAGNOSIS — I5032 Chronic diastolic (congestive) heart failure: Secondary | ICD-10-CM

## 2023-01-23 DIAGNOSIS — Z79899 Other long term (current) drug therapy: Secondary | ICD-10-CM | POA: Insufficient documentation

## 2023-01-23 DIAGNOSIS — J45909 Unspecified asthma, uncomplicated: Secondary | ICD-10-CM | POA: Insufficient documentation

## 2023-01-23 DIAGNOSIS — I110 Hypertensive heart disease with heart failure: Secondary | ICD-10-CM | POA: Insufficient documentation

## 2023-01-23 DIAGNOSIS — E119 Type 2 diabetes mellitus without complications: Secondary | ICD-10-CM | POA: Insufficient documentation

## 2023-01-23 DIAGNOSIS — D508 Other iron deficiency anemias: Secondary | ICD-10-CM | POA: Diagnosis not present

## 2023-01-23 DIAGNOSIS — F32A Depression, unspecified: Secondary | ICD-10-CM | POA: Insufficient documentation

## 2023-01-23 DIAGNOSIS — Z8249 Family history of ischemic heart disease and other diseases of the circulatory system: Secondary | ICD-10-CM | POA: Diagnosis not present

## 2023-01-23 DIAGNOSIS — L121 Cicatricial pemphigoid: Secondary | ICD-10-CM | POA: Insufficient documentation

## 2023-01-23 DIAGNOSIS — A6920 Lyme disease, unspecified: Secondary | ICD-10-CM | POA: Insufficient documentation

## 2023-01-23 DIAGNOSIS — Z833 Family history of diabetes mellitus: Secondary | ICD-10-CM | POA: Diagnosis not present

## 2023-01-23 DIAGNOSIS — E039 Hypothyroidism, unspecified: Secondary | ICD-10-CM | POA: Insufficient documentation

## 2023-01-23 DIAGNOSIS — R002 Palpitations: Secondary | ICD-10-CM | POA: Insufficient documentation

## 2023-01-23 DIAGNOSIS — D509 Iron deficiency anemia, unspecified: Secondary | ICD-10-CM | POA: Diagnosis not present

## 2023-01-23 DIAGNOSIS — R591 Generalized enlarged lymph nodes: Secondary | ICD-10-CM | POA: Insufficient documentation

## 2023-01-23 DIAGNOSIS — Z7984 Long term (current) use of oral hypoglycemic drugs: Secondary | ICD-10-CM | POA: Diagnosis not present

## 2023-01-23 MED ORDER — SPIRONOLACTONE 25 MG PO TABS: 25.0000 mg | ORAL_TABLET | Freq: Every day | ORAL | 3 refills | Status: DC

## 2023-01-23 NOTE — Patient Instructions (Addendum)
Begin spironolactone as 1 tablet every day.   Please return to the [facility] in week for lab work.  Your physician has requested that you have an echocardiogram. Echocardiography is a painless test that uses sound waves to create images of your heart. It provides your doctor with information about the size and shape of your heart and how well your heart's chambers and valves are working. This procedure takes approximately one hour. There are no restrictions for this procedure. Please do NOT wear cologne, perfume, aftershave, or lotions (deodorant is allowed). Please arrive at the [facility] at 8:45, 15 minutes prior to your 9AM appointment time on 02/13/23.   A referral has been placed to [facility]. They will call you to schedule an appointment.   Follow up in 1 month.

## 2023-01-23 NOTE — Progress Notes (Signed)
Advanced Heart Failure Clinic Note   Referring Physician: Chesley Noon, MD (EDP) PCP: Ladora Daniel, PA-C (last seen 07/24) PCP-Cardiologist: Marcina Millard, MD/ Leanora Ivanoff, [place] (last seen 06/23)  HPI:  Ms Julich is a 47 y/o female with a history of HTN, DM, cicatricial pemphigoid (autoimmune blistering disorder), generalized lymphadenopathy, pancytopenia, asthma, thyroid disease, anxiety, depression, lyme disease, iron deficiency anemia & chronic heart failure.   Was in the ED 01/05/23 due to increasing dyspnea on exertion and feeling like her heart is racing. Also had pedal edema and low grade fever. CTA is negative for PE, does show pulmonary edema with small to moderate effusions bilaterally. IV diuresed with improvement of symptoms. CT imaging does show lymphadenopathy similar to recent CT imaging performed in the [facility] system. This has been previously worked up with biopsy.   Echo 05/16/2005: EF 55-65% Echo 12/27/21: EF >55% with mild LVH  She presents today for her initial HF visit with a chief complaint of minimal SOB with moderate exertion. Has associated intermittent palpitations, minimal pedal edema at the end of the day and minimal fatigue which is improving. Denies chest pain, abdominal distention, dizziness or difficulty sleeping. Currently sleeping on 2 pillows.   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 Does not add any salt to her food except on rare occasions and tries to pick low sodium foods. Had previously tried ozempic and mounjaro (when weighed 320 lbs) but then stopped when she started having all these blisters in her mouth/ throat trying to figure out what the cause was.   Gets watery blisters in throat, mouth making it difficult to eat at times which has resulted in her becoming anemic. She says that the blisters will pop and weep fluid and then crust over. She has coughed hard enough at times to cough up  loose skin from the blisters. This is due to a rare autoimmune disorder and she's currently being followed by dermatology.   She has seen ENT in the past but has not seen GI provider but is interested in getting this looked at more closely. She is wondering if the watery blisters that she gets in her throat, mouth, gums could also potentially develop in her lungs.   Review of Systems: [y] = yes, [ ]  = no   General: Weight gain [ ] ; Weight loss [ ] ; Anorexia [ ] ; Fatigue Cove.Etienne ]; Fever [ ] ; Chills [ ] ; Weakness [ ]   Cardiac: Chest pain/pressure [ ] ; Resting SOB [ ] ; Exertional SOB [ y]; Orthopnea [ ] ;Pedal Edema [y]; Palpitations [ y]; Syncope [ ] ; Presyncope [ ] ; Paroxysmal nocturnal dyspnea[ ]   Pulmonary: Cough Cove.Etienne ]; Wheezing[ ] ; Hemoptysis[ ] ; Sputum [ ] ; Snoring [ ]   GI: Vomiting[ ] ; Dysphagia[y ]; Melena[ ] ; Hematochezia [ ] ; Heartburn[ ] ; Abdominal pain [ ] ; Constipation [ ] ; Diarrhea [ ] ; BRBPR [ ]   GU: Hematuria[ ] ; Dysuria [ ] ; Nocturia[ ]   Vascular: Pain in legs with walking [ ] ; Pain in feet with lying flat [ ] ; Non-healing sores [ ] ; Stroke [ ] ; TIA [ ] ; Slurred speech [ ] ;  Neuro: Headaches[ ] ; Vertigo[ ] ; Seizures[ ] ; Paresthesias[ ] ;Blurred vision [ ] ; Diplopia [ ] ; Vision changes [ ]   Ortho/Skin: Arthritis [ ] ; Joint pain [ ] ; Muscle pain [ ] ; Joint swelling [ ] ; Back Pain [ ] ; Rash [ ]   Psych: Depression[ ] ; Anxiety[ ]   Heme: Bleeding problems [ ] ; Clotting  disorders [ ] ; Anemia Cove.Etienne ]  Endocrine: Diabetes Cove.Etienne ]; Thyroid dysfunction[y ]   Past Medical History:  Diagnosis Date   Allergy    Anxiety    Arthritis    Asthma    Auto immune neutropenia (HCC)    Depression    Diabetes mellitus without complication (HCC)    Fibroids 02/13/2014   Friable cervix 03/15/2015   Hypertension    Hypothyroid    Irregular bleeding 03/15/2015   Lyme disease    Menometrorrhagia 02/11/2014   Obesity    Rosacea    Severe menstrual cramps 09/17/2015   Superficial fungus infection of skin  10/20/2015   Weight gain 08/31/2015    Current Outpatient Medications  Medication Sig Dispense Refill   albuterol (PROAIR HFA) 108 (90 Base) MCG/ACT inhaler Inhale 2 puffs into the lungs every 6 (six) hours as needed for wheezing or shortness of breath.      furosemide (LASIX) 20 MG tablet Take 1 tablet (20 mg total) by mouth daily for 10 days. 10 tablet 0   hydrOXYzine (ATARAX) 10 MG tablet TAKE 1 TABLET BY MOUTH THREE TIMES A DAY AS NEEDED 270 tablet 3   lisdexamfetamine (VYVANSE) 70 MG capsule Take 70 mg by mouth daily.      lisinopril (PRINIVIL,ZESTRIL) 10 MG tablet Take 1 tablet (10 mg total) by mouth daily. (Patient taking differently: Take 20 mg by mouth daily.) 90 tablet 3   metFORMIN (GLUCOPHAGE) 500 MG tablet Take 1 tablet (500 mg total) by mouth 2 (two) times daily. 180 tablet 3   metroNIDAZOLE (METROGEL) 0.75 % vaginal gel Place 1 Applicatorful vaginally at bedtime. Use for 5 nights then prn after sex 70 g 2   mycophenolate (CELLCEPT) 500 MG tablet Take by mouth.     tacrolimus (PROGRAF) 1 MG capsule Dissolve 1 capsule in of water. Use this solution to swish and spit twice daily. Store in the fridge for 1 week, then discard and make a new solution.     No current facility-administered medications for this visit.    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   Tobacco Use   Smoking status: Never   Smokeless tobacco: Never  Vaping Use   Vaping status: Never Used  Substance and Sexual Activity   Alcohol use: Not Currently    Alcohol/week: 0.0 standard drinks of alcohol    Comment: rarely   Drug use: No   Sexual activity: Yes    Birth control/protection: Surgical    Comment: supracervical hyst  Other  Topics Concern   Not on file  Social History Narrative   Drinks one soda a day    Social Determinants of Health   Financial Resource Strain: Low Risk  (12/13/2022)   Received from [facility]   Overall Financial Resource Strain (CARDIA)    Difficulty of Paying Living Expenses: Not hard at all  Food Insecurity: No Food Insecurity (12/13/2022)   Received from [facility]   Hunger Vital Sign    Worried About Running Out of Food in the Last Year: Never true    Ran Out of Food in the Last Year: Never true  Transportation Needs: No Transportation Needs (  12/13/2022)   Received from [facility] - Transportation    Lack of Transportation (Medical): No    Lack of Transportation (Non-Medical): No  Physical Activity: Insufficiently Active (12/13/2022)   Received from [facility]   Exercise Vital Sign    Days of Exercise per Week: 3 days    Minutes of Exercise per Session: 30 min  Stress: No Stress Concern Present (12/13/2022)   Received from [facility] - Occupational Stress Questionnaire    Feeling of Stress : Not at all  Social Connections: Moderately Integrated (12/13/2022)   Received from [facility]   Social Network    How would you rate your social network (family, work, friends)?: Adequate participation with social networks  Intimate Partner Violence: Not At Risk (12/13/2022)   Received from [facility]   HITS    Over the last 12 months how often did your partner physically hurt you?: 1    Over the last 12 months how often did your partner insult you or talk down to you?: 1    Over the last 12 months how often did your partner threaten you with physical harm?: 1    Over the last 12 months how often did your partner scream or curse at you?: 1      Family History  Problem Relation Age of Onset   Coronary artery disease Paternal Grandfather    Coronary artery disease Paternal Grandmother    Diabetes Paternal  Grandmother    Diabetes Maternal Grandmother    Coronary artery disease Maternal Grandmother    Diabetes Maternal Grandfather    Coronary artery disease Maternal Grandfather    Heart attack Father    Hypertension Father    Diabetes Mother    Hypertension Mother    Other Mother        MVA   Cancer Sister    Depression Sister    Vitals:   01/23/23 0942  BP: (!) 152/80  Pulse: 78  Resp: 14  SpO2: 100%  Weight: 254 lb 8 oz (115.4 kg)   Wt Readings from Last 3 Encounters:  01/23/23 254 lb 8 oz (115.4 kg)  01/05/23 260 lb (117.9 kg)  08/25/22 257 lb 8 oz (116.8 kg)   Lab Results  Component Value Date   CREATININE 0.89 01/05/2023   CREATININE 0.93 03/24/2019   CREATININE 0.89 02/20/2019   PHYSICAL EXAM: General:  Well appearing. No respiratory difficulty HEENT: normal Neck: supple. no JVD. No lymphadenopathy or thyromegaly appreciated. Cor: PMI nondisplaced. Regular rate & rhythm. No rubs, gallops or murmurs. Lungs: clear Abdomen: soft, nontender, nondistended. No hepatosplenomegaly. No bruits or masses.  Extremities: no cyanosis, clubbing, rash, edema Neuro: alert & oriented x 3, cranial nerves grossly intact. moves all 4 extremities w/o difficulty. Affect pleasant.  ECG: 01/05/23 showed NSR  ASSESSMENT & PLAN:  1: NICM with preserved ejection fraction- - suspect due to HTN - NYHA class II - euvolemic - weighing daily; reviewed to call for an overnight weight gain of > 2 pounds or a weekly weight gain of > 5 pounds - Echo 05/16/2005: EF 55-65% - Echo 12/27/21: EF >55% with mild LVH - updated echo scheduled for 02/13/23 - saw cardiology Mellissa Kohut) 06/23 - continue lisinopril 20mg  daily (she takes it at bedtime); consider changing this to entresto - will add spironolactone 25mg  daily - BMET in 1 week and again at next visit in 1 month - consider adding SGLT2  2:  HTN- - BP 152/80 - begin spiro per above - will try to get her off clonidine if possible at future  visits - saw PCP Herold Harms) 07/24 - BMP 01/05/23 showed sodium 141, potassium 3.8, creatinine 0.89 & GFR >60 - 01/09/23 potassium was 4.5; checking BMP next week after starting spiro  3: Generalized lymphadenopathy- - saw hem/onc Mechele Collin) 07/24 - needle biopsy was performed on 01/03/2023, revealing benign lymphoid tissue with fibrosis  - she says that driving to [facility] for these appt is getting tiresome; advised her that if she wanted to, she could transfer her care to the [facility] here in [place]; she will think about it  4: Iron deficiency anemia- - hemoglobin 01/05/23 was 9.7 - says that she will be getting iron infusion later this week  5: Cicatricial pemphigoid- - saw dermatology Reche Dixon) 07/24 - will make GI referral to assist with management of her mouth/ throat blisters - wonder if she would benefit from endoscopy but will defer to GI  6: DM- - continue metformin 500mg  BID - consider lifestyle center referral  - consider resuming ozempic/ mounjaro  - A1c 12/13/22 was 5.4%  Return in 1 month, sooner if needed.   Delma Freeze, FNP 01/23/23

## 2023-01-24 ENCOUNTER — Other Ambulatory Visit: Payer: Self-pay | Admitting: Family

## 2023-01-24 MED ORDER — TACROLIMUS 1 MG PO CAPS: 1.0000 mg | ORAL_CAPSULE | Freq: Two times a day (BID) | ORAL | Status: AC

## 2023-01-24 MED ORDER — DOXYCYCLINE HYCLATE 20 MG PO TABS: 20.0000 mg | ORAL_TABLET | Freq: Two times a day (BID) | ORAL | Status: AC

## 2023-01-24 MED ORDER — MYCOPHENOLATE MOFETIL 250 MG PO CAPS: 1000.0000 mg | ORAL_CAPSULE | Freq: Two times a day (BID) | ORAL | Status: AC

## 2023-01-24 MED ORDER — PREDNISONE 5 MG PO TABS: 10.0000 mg | ORAL_TABLET | Freq: Every day | ORAL | Status: AC

## 2023-01-24 NOTE — Progress Notes (Signed)
Per derm: meds have been updated in her list Doxycycline Prograf Prednisone cellcept

## 2023-01-25 DIAGNOSIS — E611 Iron deficiency: Secondary | ICD-10-CM | POA: Diagnosis not present

## 2023-01-26 DIAGNOSIS — R591 Generalized enlarged lymph nodes: Secondary | ICD-10-CM | POA: Diagnosis not present

## 2023-01-26 DIAGNOSIS — M5136 Other intervertebral disc degeneration, lumbar region: Secondary | ICD-10-CM | POA: Diagnosis not present

## 2023-01-26 DIAGNOSIS — Z9049 Acquired absence of other specified parts of digestive tract: Secondary | ICD-10-CM | POA: Diagnosis not present

## 2023-01-26 DIAGNOSIS — R162 Hepatomegaly with splenomegaly, not elsewhere classified: Secondary | ICD-10-CM | POA: Diagnosis not present

## 2023-02-01 DIAGNOSIS — L121 Cicatricial pemphigoid: Secondary | ICD-10-CM | POA: Diagnosis not present

## 2023-02-01 DIAGNOSIS — Z79899 Other long term (current) drug therapy: Secondary | ICD-10-CM | POA: Diagnosis not present

## 2023-02-05 DIAGNOSIS — R591 Generalized enlarged lymph nodes: Secondary | ICD-10-CM | POA: Diagnosis not present

## 2023-02-05 DIAGNOSIS — I10 Essential (primary) hypertension: Secondary | ICD-10-CM | POA: Diagnosis not present

## 2023-02-13 ENCOUNTER — Ambulatory Visit
Admission: RE | Admit: 2023-02-13 | Discharge: 2023-02-13 | Disposition: A | Discharge status: Home / Self Care | Payer: BC Managed Care – PPO | Source: Ambulatory Visit | Attending: Family | Admitting: Family

## 2023-02-13 DIAGNOSIS — I5032 Chronic diastolic (congestive) heart failure: Secondary | ICD-10-CM | POA: Diagnosis not present

## 2023-02-13 DIAGNOSIS — I110 Hypertensive heart disease with heart failure: Secondary | ICD-10-CM | POA: Insufficient documentation

## 2023-02-13 LAB — ECHOCARDIOGRAM COMPLETE
AR max vel: 2.78 cm2
AV Area VTI: 2.88 cm2
AV Area mean vel: 2.79 cm2
AV Mean grad: 4 mmHg
AV Peak grad: 8.6 mmHg
Ao pk vel: 1.47 m/s
Area-P 1/2: 3.36 cm2
MV VTI: 2.61 cm2
S' Lateral: 3.4 cm

## 2023-02-13 NOTE — Progress Notes (Signed)
*  PRELIMINARY RESULTS* Echocardiogram 2D Echocardiogram has been performed.  Carolyne Fiscal 02/13/2023, 10:03 AM

## 2023-02-15 DIAGNOSIS — R768 Other specified abnormal immunological findings in serum: Secondary | ICD-10-CM | POA: Diagnosis not present

## 2023-02-15 DIAGNOSIS — R591 Generalized enlarged lymph nodes: Secondary | ICD-10-CM | POA: Diagnosis not present

## 2023-02-15 DIAGNOSIS — M79641 Pain in right hand: Secondary | ICD-10-CM | POA: Diagnosis not present

## 2023-02-15 DIAGNOSIS — M79642 Pain in left hand: Secondary | ICD-10-CM | POA: Diagnosis not present

## 2023-02-15 DIAGNOSIS — I10 Essential (primary) hypertension: Secondary | ICD-10-CM | POA: Diagnosis not present

## 2023-02-15 DIAGNOSIS — L121 Cicatricial pemphigoid: Secondary | ICD-10-CM | POA: Diagnosis not present

## 2023-02-22 NOTE — Progress Notes (Signed)
Advanced Heart Failure Clinic Note    PCP: Ladora Daniel, PA-C (last seen 07/24) Primary Cardiologist: Marcina Millard, MD/ Leanora Ivanoff, [place] (last seen 06/23)  HPI:  Krista [name] is a 47 y/o female with a history of HTN, DM, cicatricial pemphigoid (autoimmune blistering disorder), generalized lymphadenopathy, pancytopenia, asthma, thyroid disease, anxiety, depression, lyme disease, iron deficiency anemia & chronic heart failure.   Was in the ED 01/05/23 due to increasing dyspnea on exertion and feeling like her heart is racing. Also had pedal edema and low grade fever. CTA is negative for PE, does show pulmonary edema with small to moderate effusions bilaterally. IV diuresed with improvement of symptoms. CT imaging does show lymphadenopathy similar to recent CT imaging performed in the [facility] system. This has been previously worked up with biopsy.   Echo 05/16/2005: EF 55-65% Echo 12/27/21: EF >55% with mild LVH Echo 02/13/23: EF 60-65%  She presents today for a HF follow-up visit with a chief complaint of minimal shortness of breath with moderate exertion. Chronic in nature. Has associated fatigue along with this. Denies chest pain, cough, palpitations, abdominal distention, pedal edema or dizziness. Has some difficulty sleeping but they are currently sleeping on mattresses on the floor due to being in the middle of a remodel. Currently has a blister on the roof of her mouth.   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 Does not add any salt to her food except on rare occasions but does try to pick low sodium foods although recently has gotten back into bad food habits. Had previously tried ozempic and mounjaro (when weighed 320 lbs) but then stopped when she started having all these blisters in her mouth/ throat trying to figure out what the cause was.   Gets watery blisters in throat, mouth making it difficult to eat at times which has  resulted in her becoming anemic. She says that the blisters will pop and weep fluid and then crust over. She has coughed hard enough at times to cough up loose skin from the blisters. This is due to a rare autoimmune disorder. She has recently gotten established with a [facility] dermatologist who repeated the biopsy and confirmed the diagnosis. Discussing using a chemo medication if insurance approves it. Currently has prednisone that she can take but she's taken it before and says that she gets relief while taking it but then gets a worse flare after it's stopped.   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 Currently waiting for the lockdown to be lifted.   At last visit, spironolactone 25mg  daily was added. No issues with it that she's aware of. Had a lot of blood work done from dermatology/ rheumatology but, unfortunately, no BMET was drawn.   ROS: All systems negative except as listed in HPI, PMH and Problem List.  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   Tobacco Use   Smoking status: Never   Smokeless tobacco: Never  Vaping Use   Vaping status: Never Used  Substance and Sexual Activity   Alcohol use: Not Currently  Alcohol/week: 0.0 standard drinks of alcohol    Comment: rarely   Drug use: No   Sexual activity: Yes    Birth control/protection: Surgical    Comment: supracervical hyst  Other Topics Concern   Not on file  Social History Narrative   Drinks one soda a day    Social Determinants of Health   Financial Resource Strain: Low Risk  (12/13/2022)   Received from [facility]   Overall Financial Resource Strain (CARDIA)    Difficulty of Paying Living Expenses: Not hard at  all  Food Insecurity: No Food Insecurity (12/13/2022)   Received from [facility]   Hunger Vital Sign    Worried About Running Out of Food in the Last Year: Never true    Ran Out of Food in the Last Year: Never true  Transportation Needs: No Transportation Needs (12/13/2022)   Received from [facility] - Transportation    Lack of Transportation (Medical): No    Lack of Transportation (Non-Medical): No  Physical Activity: Insufficiently Active (12/13/2022)   Received from [facility]   Exercise Vital Sign    Days of Exercise per Week: 3 days    Minutes of Exercise per Session: 30 min  Stress: No Stress Concern Present (12/13/2022)   Received from [facility] - Occupational Stress Questionnaire    Feeling of Stress : Not at all  Social Connections: Moderately Integrated (12/13/2022)   Received from [facility]   Social Network    How would you rate your social network (family, work, friends)?: Adequate participation with social networks  Intimate Partner Violence: Not At Risk (12/13/2022)   Received from [facility]   HITS    Over the last 12 months how often did your partner physically hurt you?: 1    Over the last 12 months how often did your partner insult you or talk down to you?: 1    Over the last 12 months how often did your partner threaten you with physical harm?: 1    Over the last 12 months how often did your partner scream or curse at you?: 1    FH:  Family History  Problem Relation Age of Onset   Coronary artery disease Paternal Grandfather    Coronary artery disease Paternal Grandmother    Diabetes Paternal Grandmother    Diabetes Maternal Grandmother    Coronary artery disease Maternal Grandmother    Diabetes Maternal Grandfather    Coronary artery disease Maternal Grandfather    Heart attack Father    Hypertension Father    Diabetes Mother    Hypertension Mother    Other Mother        MVA    Cancer Sister    Depression Sister     Past Medical History:  Diagnosis Date   ADHD    Allergy    Anxiety    Arthritis    Asthma    Auto immune neutropenia (HCC)    Cicatricial pemphigoid    Depression    Diabetes mellitus without complication (HCC)    Fibroids 02/13/2014   Friable cervix 03/15/2015   Hypertension    Hypothyroid    Irregular bleeding 03/15/2015   Lyme disease    Menometrorrhagia 02/11/2014   Obesity    Rosacea    Severe menstrual cramps 09/17/2015   Superficial fungus infection of skin 10/20/2015   Weight gain 08/31/2015    Current Outpatient Medications  Medication Sig  Dispense Refill   albuterol (PROAIR HFA) 108 (90 Base) MCG/ACT inhaler Inhale 2 puffs into the lungs every 6 (six) hours as needed for wheezing or shortness of breath.      atomoxetine (STRATTERA) 80 MG capsule Take 80 mg by mouth daily.     baclofen (LIORESAL) 10 MG tablet Take 10 mg by mouth 2 (two) times daily.     cloNIDine (CATAPRES) 0.1 MG tablet Take 0.1 mg by mouth daily.     doxycycline (PERIOSTAT) 20 MG tablet Take 1 tablet (20 mg total) by mouth 2 (two) times daily.     hydrOXYzine (ATARAX) 10 MG tablet TAKE 1 TABLET BY MOUTH THREE TIMES A DAY AS NEEDED 270 tablet 3   lisinopril (PRINIVIL,ZESTRIL) 10 MG tablet Take 1 tablet (10 mg total) by mouth daily. (Patient taking differently: Take 20 mg by mouth daily.) 90 tablet 3   meloxicam (MOBIC) 15 MG tablet Take 15 mg by mouth daily.     metFORMIN (GLUCOPHAGE) 500 MG tablet Take 1 tablet (500 mg total) by mouth 2 (two) times daily. 180 tablet 3   Methylphenidate HCl ER, PM, (JORNAY PM) 60 MG CP24 Take 60 mg by mouth daily.     mycophenolate (CELLCEPT) 250 MG capsule Take 4 capsules (1,000 mg total) by mouth 2 (two) times daily.     predniSONE (DELTASONE) 5 MG tablet Take 2 tablets (10 mg total) by mouth daily with breakfast.     spironolactone (ALDACTONE) 25 MG tablet Take 1 tablet (25 mg total) by mouth daily. 90 tablet 3    tacrolimus (PROGRAF) 1 MG capsule Take 1 capsule (1 mg total) by mouth 2 (two) times daily.     No current facility-administered medications for this visit.   Vitals:   02/23/23 0934  BP: (!) 140/75  Pulse: 87  SpO2: 100%  Weight: 260 lb (117.9 kg)  Height: 5' 9.5" (1.765 m)   Wt Readings from Last 3 Encounters:  02/23/23 260 lb (117.9 kg)  01/23/23 254 lb 8 oz (115.4 kg)  01/05/23 260 lb (117.9 kg)   Lab Results  Component Value Date   CREATININE 0.89 01/05/2023   CREATININE 0.93 03/24/2019   CREATININE 0.89 02/20/2019   PHYSICAL EXAM:  General:  Well appearing. No resp difficulty HEENT: normal Neck: supple. JVP flat. . No lymphadenopathy or thryomegaly appreciated. Cor: PMI normal. Regular rate & rhythm. No rubs, gallops or murmurs. Lungs: clear Abdomen: soft, nontender, nondistended. No hepatosplenomegaly. No bruits or masses.  Extremities: no cyanosis, clubbing, rash, edema Neuro: alert & orientedx3, cranial nerves grossly intact. Moves all 4 extremities w/o difficulty. Affect pleasant.   ECG: not done   ASSESSMENT & PLAN:  1: NICM with preserved ejection fraction- - suspect due to HTN - NYHA class II - euvolemic - weighing daily; reviewed to call for an overnight weight gain of > 2 pounds or a weekly weight gain of > 5 pounds - weight up 4 pounds from last visit here 1 month ago; she admits to getting back into bad eating habits due to life stressors - Echo 05/16/2005: EF 55-65% - Echo 12/27/21: EF >55% with mild LVH - Echo 02/13/23: EF 60-65% - saw cardiology Mellissa Kohut) 06/23 - continue lisinopril 20mg  daily (she takes it at bedtime) until gone (has ~ 2 weeks left) and then begin valsartan 40mg  daily - continue spironolactone 25mg  daily - BMET next week & then again at next OV - consider adding SGLT2  2: HTN- - BP 140/75 - take clonidine  every other day until gone and then do not refill this; if BP control is still needed, will titrate up valsartan  - saw  PCP Herold Harms) 07/24 - BMP 01/05/23 showed sodium 141, potassium 3.8, creatinine 0.89 & GFR >60 - 01/09/23 potassium was 4.5 - BMET next week  3: Generalized lymphadenopathy- - saw hem/onc Mechele Collin) 07/24 - needle biopsy was performed on 01/03/2023, revealing benign lymphoid tissue with fibrosis  - she says that driving to [facility] for these appt is getting tiresome; advised her that if she wanted to, she could transfer her care to the [facility] here in [place]; she will think about it  4: Iron deficiency anemia- - hemoglobin 02/15/23 was 12.5 - iron infusion done 01/25/23  5: Cicatricial pemphigoid- - saw dermatology ([place]) 08/24 - discussion had about starting Rituxan or IVIG  - she hasn't heard from GI so their OV number was provided; if she is able to start chemo treatment, she may not need the referral after all  6: DM- - continue metformin 500mg  BID - consider lifestyle center referral  - consider resuming ozempic/ mounjaro  - A1c 12/13/22 was 5.4%  Return in 6 weeks, sooner if needed.

## 2023-02-23 ENCOUNTER — Ambulatory Visit: Payer: BC Managed Care – PPO | Attending: Family | Admitting: Family

## 2023-02-23 ENCOUNTER — Encounter: Payer: Self-pay | Admitting: Family

## 2023-02-23 VITALS — BP 140/75 | HR 87 | Ht 69.5 in | Wt 260.0 lb

## 2023-02-23 DIAGNOSIS — I428 Other cardiomyopathies: Secondary | ICD-10-CM | POA: Diagnosis not present

## 2023-02-23 DIAGNOSIS — Z8249 Family history of ischemic heart disease and other diseases of the circulatory system: Secondary | ICD-10-CM | POA: Diagnosis not present

## 2023-02-23 DIAGNOSIS — Z79899 Other long term (current) drug therapy: Secondary | ICD-10-CM | POA: Diagnosis not present

## 2023-02-23 DIAGNOSIS — E119 Type 2 diabetes mellitus without complications: Secondary | ICD-10-CM | POA: Diagnosis not present

## 2023-02-23 DIAGNOSIS — Z7984 Long term (current) use of oral hypoglycemic drugs: Secondary | ICD-10-CM | POA: Insufficient documentation

## 2023-02-23 DIAGNOSIS — R591 Generalized enlarged lymph nodes: Secondary | ICD-10-CM | POA: Diagnosis not present

## 2023-02-23 DIAGNOSIS — J45909 Unspecified asthma, uncomplicated: Secondary | ICD-10-CM | POA: Insufficient documentation

## 2023-02-23 DIAGNOSIS — E039 Hypothyroidism, unspecified: Secondary | ICD-10-CM | POA: Diagnosis not present

## 2023-02-23 DIAGNOSIS — I110 Hypertensive heart disease with heart failure: Secondary | ICD-10-CM | POA: Insufficient documentation

## 2023-02-23 DIAGNOSIS — D508 Other iron deficiency anemias: Secondary | ICD-10-CM

## 2023-02-23 DIAGNOSIS — D509 Iron deficiency anemia, unspecified: Secondary | ICD-10-CM | POA: Insufficient documentation

## 2023-02-23 DIAGNOSIS — L121 Cicatricial pemphigoid: Secondary | ICD-10-CM | POA: Diagnosis not present

## 2023-02-23 DIAGNOSIS — I10 Essential (primary) hypertension: Secondary | ICD-10-CM | POA: Diagnosis not present

## 2023-02-23 DIAGNOSIS — I5032 Chronic diastolic (congestive) heart failure: Secondary | ICD-10-CM | POA: Diagnosis not present

## 2023-02-23 DIAGNOSIS — Z833 Family history of diabetes mellitus: Secondary | ICD-10-CM | POA: Diagnosis not present

## 2023-02-23 MED ORDER — VALSARTAN 40 MG PO TABS: 40.0000 mg | ORAL_TABLET | Freq: Every day | ORAL | 5 refills | Status: DC

## 2023-02-23 NOTE — Patient Instructions (Addendum)
Go to the [facility] in 1 week to complete your blood work.  STOP/DISCONTINUE LISINOPRIL once you've finished your bottle.  START VALSARTAN 40 MG ONCE DAILY once you've finished the Lisinopril.  TAKE CLONIDINE every other day until you finish the bottle and DISCONTINUE  Follow up in 6 wks  We placed a referral at your last visit. Here is the information. It'll also be on your last AVS which you can view on MyChart. Where: [facility] at [facility] Address: [address], [address] [place] [address] Phone: [phone] Expires: 01/23/2024 (requested)

## 2023-03-15 ENCOUNTER — Encounter: Payer: Self-pay | Admitting: Family

## 2023-03-16 ENCOUNTER — Other Ambulatory Visit: Payer: Self-pay | Admitting: Family

## 2023-03-16 DIAGNOSIS — L121 Cicatricial pemphigoid: Secondary | ICD-10-CM | POA: Diagnosis not present

## 2023-03-16 DIAGNOSIS — Z79899 Other long term (current) drug therapy: Secondary | ICD-10-CM | POA: Diagnosis not present

## 2023-03-16 MED ORDER — FUROSEMIDE 20 MG PO TABS: 20.0000 mg | ORAL_TABLET | Freq: Every day | ORAL | 3 refills | Status: AC

## 2023-04-03 DIAGNOSIS — N289 Disorder of kidney and ureter, unspecified: Secondary | ICD-10-CM | POA: Diagnosis not present

## 2023-04-03 DIAGNOSIS — R162 Hepatomegaly with splenomegaly, not elsewhere classified: Secondary | ICD-10-CM | POA: Diagnosis not present

## 2023-04-03 DIAGNOSIS — R911 Solitary pulmonary nodule: Secondary | ICD-10-CM | POA: Diagnosis not present

## 2023-04-03 DIAGNOSIS — R591 Generalized enlarged lymph nodes: Secondary | ICD-10-CM | POA: Diagnosis not present

## 2023-04-10 DIAGNOSIS — E611 Iron deficiency: Secondary | ICD-10-CM | POA: Diagnosis not present

## 2023-04-10 DIAGNOSIS — R591 Generalized enlarged lymph nodes: Secondary | ICD-10-CM | POA: Diagnosis not present

## 2023-04-10 DIAGNOSIS — D61818 Other pancytopenia: Secondary | ICD-10-CM | POA: Diagnosis not present

## 2023-04-11 ENCOUNTER — Encounter: Payer: BC Managed Care – PPO | Admitting: Family

## 2023-04-11 ENCOUNTER — Telehealth: Payer: Self-pay | Admitting: Family

## 2023-04-11 NOTE — Telephone Encounter (Signed)
Patient did not show for her Heart Failure Clinic appointment on 04/11/23.

## 2023-04-11 NOTE — Progress Notes (Deleted)
Advanced Heart Failure Clinic Note    PCP: Ladora Daniel, PA-C (last seen 07/24) Primary Cardiologist: Marcina Millard, MD/ Leanora Ivanoff, [place] (last seen 06/23)  HPI:  Ms Weatherwax is a 47 y/o female with a history of HTN, DM, cicatricial pemphigoid (autoimmune blistering disorder), generalized lymphadenopathy, pancytopenia, asthma, thyroid disease, anxiety, depression, lyme disease, iron deficiency anemia & chronic heart failure.   Was in the ED 01/05/23 due to increasing dyspnea on exertion and feeling like her heart is racing. Also had pedal edema and low grade fever. CTA is negative for PE, does show pulmonary edema with small to moderate effusions bilaterally. IV diuresed with improvement of symptoms. CT imaging does show lymphadenopathy similar to recent CT imaging performed in the [facility] system. This has been previously worked up with biopsy.   Echo 05/16/2005: EF 55-65% Echo 12/27/21: EF >55% with mild LVH Echo 02/13/23: EF 60-65%  She presents today for a HF follow-up visit with a chief complaint of minimal shortness of breath with moderate exertion. Chronic in nature. Has associated fatigue along with this. Denies chest pain, cough, palpitations, abdominal distention, pedal edema or dizziness. Has some difficulty sleeping but they are currently sleeping on mattresses on the floor due to being in the middle of a remodel. Currently has a blister on the roof of her mouth.   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 Does not add any salt to her food except on rare occasions but does try to pick low sodium foods although recently has gotten back into bad food habits. Had previously tried ozempic and mounjaro (when weighed 320 lbs) but then stopped when she started having all these blisters in her mouth/ throat trying to figure out what the cause was.   Gets watery blisters in throat, mouth making it difficult to eat at times which has  resulted in her becoming anemic. She says that the blisters will pop and weep fluid and then crust over. She has coughed hard enough at times to cough up loose skin from the blisters. This is due to a rare autoimmune disorder. She has recently gotten established with a [facility] dermatologist who repeated the biopsy and confirmed the diagnosis. Discussing using a chemo medication if insurance approves it. Currently has prednisone that she can take but she's taken it before and says that she gets relief while taking it but then gets a worse flare after it's stopped.   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 Currently waiting for the lockdown to be lifted.   At last visit, spironolactone 25mg  daily was added. No issues with it that she's aware of. Had a lot of blood work done from dermatology/ rheumatology but, unfortunately, no BMET was drawn.   ROS: All systems negative except as listed in HPI, PMH and Problem List.  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   Tobacco Use   Smoking status: Never   Smokeless tobacco: Never  Vaping Use   Vaping status: Never Used  Substance and Sexual Activity   Alcohol use: Not Currently  Alcohol/week: 0.0 standard drinks of alcohol    Comment: rarely   Drug use: No   Sexual activity: Yes    Birth control/protection: Surgical    Comment: supracervical hyst  Other Topics Concern   Not on file  Social History Narrative   Drinks one soda a day    Social Determinants of Health   Financial Resource Strain: Low Risk  (12/13/2022)   Received from [facility]   Overall Financial Resource Strain (CARDIA)    Difficulty of Paying Living Expenses: Not hard at  all  Food Insecurity: No Food Insecurity (12/13/2022)   Received [facility]   Hunger Vital Sign    Worried About Running Out of Food in the Last Year: Never true    Ran Out of Food in the Last Year: Never true  Transportation Needs: No Transportation Needs (12/13/2022)   Received from [facility] - Transportation    Lack of Transportation (Medical): No    Lack of Transportation (Non-Medical): No  Physical Activity: Insufficiently Active (12/13/2022)   Received from [facility]   Exercise Vital Sign    Days of Exercise per Week: 3 days    Minutes of Exercise per Session: 30 min  Stress: No Stress Concern Present (12/13/2022)   Received from [facility] - Occupational Stress Questionnaire    Feeling of Stress : Not at all  Social Connections: Moderately Integrated (12/13/2022)   Received from [facility]   Social Network    How would you rate your social network (family, work, friends)?: Adequate participation with social networks  Intimate Partner Violence: Not At Risk (12/13/2022)   Received from [facility]   HITS    Over the last 12 months how often did your partner physically hurt you?: 1    Over the last 12 months how often did your partner insult you or talk down to you?: 1    Over the last 12 months how often did your partner threaten you with physical harm?: 1    Over the last 12 months how often did your partner scream or curse at you?: 1    FH:  Family History  Problem Relation Age of Onset   Coronary artery disease Paternal Grandfather    Coronary artery disease Paternal Grandmother    Diabetes Paternal Grandmother    Diabetes Maternal Grandmother    Coronary artery disease Maternal Grandmother    Diabetes Maternal Grandfather    Coronary artery disease Maternal Grandfather    Heart attack Father    Hypertension Father    Diabetes Mother    Hypertension Mother    Other Mother        MVA    Cancer Sister    Depression Sister     Past Medical History:  Diagnosis Date   ADHD    Allergy    Anxiety    Arthritis    Asthma    Auto immune neutropenia (HCC)    Cicatricial pemphigoid    Depression    Diabetes mellitus without complication (HCC)    Fibroids 02/13/2014   Friable cervix 03/15/2015   Hypertension    Hypothyroid    Irregular bleeding 03/15/2015   Lyme disease    Menometrorrhagia 02/11/2014   Obesity    Rosacea    Severe menstrual cramps 09/17/2015   Superficial fungus infection of skin 10/20/2015   Weight gain 08/31/2015    Current Outpatient Medications  Medication Sig  Dispense Refill   albuterol (PROAIR HFA) 108 (90 Base) MCG/ACT inhaler Inhale 2 puffs into the lungs every 6 (six) hours as needed for wheezing or shortness of breath.      atomoxetine (STRATTERA) 80 MG capsule Take 80 mg by mouth daily.     baclofen (LIORESAL) 10 MG tablet Take 10 mg by mouth 2 (two) times daily.     cloNIDine (CATAPRES) 0.1 MG tablet Take 0.1 mg by mouth daily.     doxycycline (PERIOSTAT) 20 MG tablet Take 1 tablet (20 mg total) by mouth 2 (two) times daily. (Patient not taking: Reported on 02/23/2023)     furosemide (LASIX) 20 MG tablet Take 1 tablet (20 mg total) by mouth daily. 90 tablet 3   hydrOXYzine (ATARAX) 10 MG tablet TAKE 1 TABLET BY MOUTH THREE TIMES A DAY AS NEEDED 270 tablet 3   lisinopril (PRINIVIL,ZESTRIL) 10 MG tablet Take 1 tablet (10 mg total) by mouth daily. (Patient taking differently: Take 20 mg by mouth daily.) 90 tablet 3   meloxicam (MOBIC) 15 MG tablet Take 15 mg by mouth daily.     metFORMIN (GLUCOPHAGE) 500 MG tablet Take 1 tablet (500 mg total) by mouth 2 (two) times daily. 180 tablet 3   Methylphenidate HCl ER, PM, (JORNAY PM) 60 MG CP24 Take 60 mg by mouth daily.     mycophenolate (CELLCEPT) 250 MG capsule Take 4 capsules (1,000 mg total) by mouth 2 (two) times daily. (Patient not taking: Reported on 02/23/2023)     predniSONE (DELTASONE) 5 MG  tablet Take 2 tablets (10 mg total) by mouth daily with breakfast. (Patient not taking: Reported on 02/23/2023)     spironolactone (ALDACTONE) 25 MG tablet Take 1 tablet (25 mg total) by mouth daily. 90 tablet 3   tacrolimus (PROGRAF) 1 MG capsule Take 1 capsule (1 mg total) by mouth 2 (two) times daily. (Patient not taking: Reported on 02/23/2023)     valsartan (DIOVAN) 40 MG tablet Take 1 tablet (40 mg total) by mouth daily. 30 tablet 5   No current facility-administered medications for this visit.   There were no vitals filed for this visit.  Wt Readings from Last 3 Encounters:  02/23/23 260 lb (117.9 kg)  01/23/23 254 lb 8 oz (115.4 kg)  01/05/23 260 lb (117.9 kg)   Lab Results  Component Value Date   CREATININE 0.89 01/05/2023   CREATININE 0.93 03/24/2019   CREATININE 0.89 02/20/2019   PHYSICAL EXAM:  General:  Well appearing. No resp difficulty HEENT: normal Neck: supple. JVP flat. . No lymphadenopathy or thryomegaly appreciated. Cor: PMI normal. Regular rate & rhythm. No rubs, gallops or murmurs. Lungs: clear Abdomen: soft, nontender, nondistended. No hepatosplenomegaly. No bruits or masses.  Extremities: no cyanosis, clubbing, rash, edema Neuro: alert & orientedx3, cranial nerves grossly intact. Moves all 4 extremities w/o difficulty. Affect pleasant.   ECG: not done   ASSESSMENT & PLAN:  1: NICM with preserved ejection fraction- - suspect due to HTN - NYHA class II - euvolemic - weighing daily; reviewed to call for an overnight weight gain of > 2 pounds or a weekly weight gain of > 5 pounds - weight up 4 pounds from last visit here 1 month ago; she admits to getting back into bad eating habits due to life stressors - Echo 05/16/2005: EF 55-65% - Echo 12/27/21: EF >55% with mild LVH - Echo 02/13/23: EF 60-65% - saw cardiology Mellissa Kohut) 06/23 - continue lisinopril 20mg  daily (she takes it at  bedtime) until gone (has ~ 2 weeks left) and then begin valsartan 40mg  daily -  continue spironolactone 25mg  daily - BMET next week & then again at next OV - consider adding SGLT2  2: HTN- - BP 140/75 - take clonidine every other day until gone and then do not refill this; if BP control is still needed, will titrate up valsartan  - saw PCP Herold Harms) 07/24 - BMP 01/05/23 showed sodium 141, potassium 3.8, creatinine 0.89 & GFR >60 - 01/09/23 potassium was 4.5 - BMET next week  3: Generalized lymphadenopathy- - saw hem/onc Mechele Collin) 07/24 - needle biopsy was performed on 01/03/2023, revealing benign lymphoid tissue with fibrosis  - she says that driving to [facility] for these appt is getting tiresome; advised her that if she wanted to, she could transfer her care to [facility] here in [place]; she will think about it  4: Iron deficiency anemia- - hemoglobin 02/15/23 was 12.5 - iron infusion done 01/25/23  5: Cicatricial pemphigoid- - saw dermatology ([place]) 08/24 - discussion had about starting Rituxan or IVIG  - she hasn't heard from GI so their OV number was provided; if she is able to start chemo treatment, she may not need the referral after all  6: DM- - continue metformin 500mg  BID - consider lifestyle center referral  - consider resuming ozempic/ mounjaro  - A1c 12/13/22 was 5.4%  Return in 6 weeks, sooner if needed.

## 2023-04-19 ENCOUNTER — Other Ambulatory Visit: Payer: Self-pay | Admitting: Adult Health

## 2023-04-19 DIAGNOSIS — Z86018 Personal history of other benign neoplasm: Secondary | ICD-10-CM | POA: Diagnosis not present

## 2023-04-19 DIAGNOSIS — K120 Recurrent oral aphthae: Secondary | ICD-10-CM | POA: Diagnosis not present

## 2023-04-19 DIAGNOSIS — L718 Other rosacea: Secondary | ICD-10-CM | POA: Diagnosis not present

## 2023-04-19 DIAGNOSIS — L578 Other skin changes due to chronic exposure to nonionizing radiation: Secondary | ICD-10-CM | POA: Diagnosis not present

## 2023-05-01 DIAGNOSIS — L121 Cicatricial pemphigoid: Secondary | ICD-10-CM | POA: Diagnosis not present

## 2023-05-15 DIAGNOSIS — L121 Cicatricial pemphigoid: Secondary | ICD-10-CM | POA: Diagnosis not present

## 2023-05-15 DIAGNOSIS — Z79899 Other long term (current) drug therapy: Secondary | ICD-10-CM | POA: Diagnosis not present

## 2023-06-05 ENCOUNTER — Other Ambulatory Visit: Payer: Self-pay | Admitting: Family

## 2023-06-29 DIAGNOSIS — E66813 Obesity, class 3: Secondary | ICD-10-CM | POA: Diagnosis not present

## 2023-06-29 DIAGNOSIS — Z79899 Other long term (current) drug therapy: Secondary | ICD-10-CM | POA: Diagnosis not present

## 2023-06-29 DIAGNOSIS — E1159 Type 2 diabetes mellitus with other circulatory complications: Secondary | ICD-10-CM | POA: Diagnosis not present

## 2023-06-29 DIAGNOSIS — Z1330 Encounter for screening examination for mental health and behavioral disorders, unspecified: Secondary | ICD-10-CM | POA: Diagnosis not present

## 2023-06-29 DIAGNOSIS — E1165 Type 2 diabetes mellitus with hyperglycemia: Secondary | ICD-10-CM | POA: Diagnosis not present

## 2023-06-29 DIAGNOSIS — I152 Hypertension secondary to endocrine disorders: Secondary | ICD-10-CM | POA: Diagnosis not present

## 2023-06-29 DIAGNOSIS — D84821 Immunodeficiency due to drugs: Secondary | ICD-10-CM | POA: Diagnosis not present

## 2023-06-29 DIAGNOSIS — F988 Other specified behavioral and emotional disorders with onset usually occurring in childhood and adolescence: Secondary | ICD-10-CM | POA: Diagnosis not present

## 2023-07-13 DIAGNOSIS — R161 Splenomegaly, not elsewhere classified: Secondary | ICD-10-CM | POA: Diagnosis not present

## 2023-07-13 DIAGNOSIS — R590 Localized enlarged lymph nodes: Secondary | ICD-10-CM | POA: Diagnosis not present

## 2023-07-13 DIAGNOSIS — R911 Solitary pulmonary nodule: Secondary | ICD-10-CM | POA: Diagnosis not present

## 2023-07-13 DIAGNOSIS — R591 Generalized enlarged lymph nodes: Secondary | ICD-10-CM | POA: Diagnosis not present

## 2023-07-18 DIAGNOSIS — R161 Splenomegaly, not elsewhere classified: Secondary | ICD-10-CM | POA: Diagnosis not present

## 2023-07-18 DIAGNOSIS — R591 Generalized enlarged lymph nodes: Secondary | ICD-10-CM | POA: Diagnosis not present

## 2023-07-18 DIAGNOSIS — L121 Cicatricial pemphigoid: Secondary | ICD-10-CM | POA: Diagnosis not present

## 2023-09-06 DIAGNOSIS — Z79899 Other long term (current) drug therapy: Secondary | ICD-10-CM | POA: Diagnosis not present

## 2023-09-06 DIAGNOSIS — L121 Cicatricial pemphigoid: Secondary | ICD-10-CM | POA: Diagnosis not present

## 2023-09-13 DIAGNOSIS — Z1231 Encounter for screening mammogram for malignant neoplasm of breast: Secondary | ICD-10-CM | POA: Diagnosis not present

## 2023-10-16 ENCOUNTER — Other Ambulatory Visit: Payer: Self-pay | Admitting: Family

## 2023-10-30 DIAGNOSIS — L121 Cicatricial pemphigoid: Secondary | ICD-10-CM | POA: Diagnosis not present

## 2023-10-31 ENCOUNTER — Emergency Department
Admission: EM | Admit: 2023-10-31 | Discharge: 2023-10-31 | Disposition: A | Discharge status: Home / Self Care | Attending: Emergency Medicine | Admitting: Emergency Medicine

## 2023-10-31 ENCOUNTER — Emergency Department

## 2023-10-31 DIAGNOSIS — R0789 Other chest pain: Secondary | ICD-10-CM | POA: Diagnosis not present

## 2023-10-31 DIAGNOSIS — T451X5A Adverse effect of antineoplastic and immunosuppressive drugs, initial encounter: Secondary | ICD-10-CM | POA: Insufficient documentation

## 2023-10-31 DIAGNOSIS — T7840XA Allergy, unspecified, initial encounter: Secondary | ICD-10-CM | POA: Diagnosis not present

## 2023-10-31 DIAGNOSIS — L299 Pruritus, unspecified: Secondary | ICD-10-CM | POA: Diagnosis not present

## 2023-10-31 DIAGNOSIS — L121 Cicatricial pemphigoid: Secondary | ICD-10-CM | POA: Diagnosis not present

## 2023-10-31 DIAGNOSIS — R7309 Other abnormal glucose: Secondary | ICD-10-CM | POA: Insufficient documentation

## 2023-10-31 LAB — CBC
HCT: 40 % (ref 36.0–46.0)
Hemoglobin: 13.5 g/dL (ref 12.0–15.0)
MCH: 30.3 pg (ref 26.0–34.0)
MCHC: 33.8 g/dL (ref 30.0–36.0)
MCV: 89.7 fL (ref 80.0–100.0)
Platelets: 278 10*3/uL (ref 150–400)
RBC: 4.46 MIL/uL (ref 3.87–5.11)
RDW: 12.6 % (ref 11.5–15.5)
WBC: 16.9 10*3/uL — ABNORMAL HIGH (ref 4.0–10.5)
nRBC: 0 % (ref 0.0–0.2)

## 2023-10-31 LAB — BASIC METABOLIC PANEL WITH GFR
Anion gap: 13 (ref 5–15)
BUN: 25 mg/dL — ABNORMAL HIGH (ref 6–20)
CO2: 13 mmol/L — ABNORMAL LOW (ref 22–32)
Calcium: 9.4 mg/dL (ref 8.9–10.3)
Chloride: 110 mmol/L (ref 98–111)
Creatinine, Ser: 0.95 mg/dL (ref 0.44–1.00)
GFR, Estimated: >60 mL/min (ref >60)
Glucose, Bld: 420 mg/dL — ABNORMAL HIGH (ref 70–99)
Potassium: 4.1 mmol/L (ref 3.5–5.1)
Sodium: 136 mmol/L (ref 135–145)

## 2023-10-31 LAB — CBG MONITORING, ED: Glucose-Capillary: 367 mg/dL — ABNORMAL HIGH (ref 70–99)

## 2023-10-31 LAB — TROPONIN I (HIGH SENSITIVITY): Troponin I (High Sensitivity): 5 ng/L (ref <18)

## 2023-10-31 MED ORDER — DIPHENHYDRAMINE HCL 50 MG/ML IJ SOLN
25.0000 mg | Freq: Once | INTRAMUSCULAR | Status: AC
  Administered 2023-10-31: 25 mg via INTRAVENOUS
  Filled 2023-10-31: qty 1

## 2023-10-31 MED ORDER — INSULIN ASPART 100 UNIT/ML IJ SOLN
8.0000 [IU] | Freq: Once | INTRAMUSCULAR | Status: AC
Start: 2023-10-31 — End: 2023-10-31
  Administered 2023-10-31: 8 [IU] via INTRAVENOUS
  Filled 2023-10-31: qty 1

## 2023-10-31 MED ORDER — SODIUM CHLORIDE 0.9 % IV SOLN: Freq: Once | INTRAVENOUS | Status: AC

## 2023-10-31 MED ORDER — FAMOTIDINE IN NACL 20-0.9 MG/50ML-% IV SOLN
20.0000 mg | Freq: Once | INTRAVENOUS | Status: AC
  Administered 2023-10-31: 20 mg via INTRAVENOUS
  Filled 2023-10-31: qty 50

## 2023-10-31 MED ORDER — METHYLPREDNISOLONE SODIUM SUCC 125 MG IJ SOLR
125.0000 mg | Freq: Once | INTRAMUSCULAR | Status: AC
  Administered 2023-10-31: 125 mg via INTRAVENOUS
  Filled 2023-10-31: qty 2

## 2023-10-31 NOTE — ED Notes (Signed)
 Pt states CP has resolved - rated 0/10. States feeling much better.

## 2023-10-31 NOTE — ED Provider Notes (Signed)
 [facility] Provider Note    Event Date/Time   First MD Initiated Contact with Patient 10/31/23 1808     (approximate)   History   Chest Pain   HPI  Krista [name] is a 48 y.o. 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  She was pretreated prior to it because of a history of allergic reaction to it, approximately an hour after completing infusion she developed itching in her ears and her throat.  She took Benadryl  at home and came to the emergency department no difficulty breathing at this time.     Physical Exam   Triage Vital Signs: ED Triage Vitals  Encounter Vitals Group     BP 10/31/23 1742 (!) 159/113     Systolic BP Percentile --      Diastolic BP Percentile --      Pulse Rate 10/31/23 1742 (!) 104     Resp 10/31/23 1742 18     Temp 10/31/23 1742 98.9 F (37.2 C)     Temp Source 10/31/23 1742 Oral     SpO2 10/31/23 1742 95 %     Weight 10/31/23 1746 131.5 kg (290 lb)     Height 10/31/23 1746 1.778 m (5\' 10" )     Head Circumference --      Peak Flow --      Pain Score 10/31/23 1746 0     Pain Loc --      Pain Education --      Exclude from Growth Chart --     Most recent vital signs: Vitals:   10/31/23 1900 10/31/23 1930  BP: (!) 141/72 129/73  Pulse: 89 92  Resp: 17 19  Temp:    SpO2: 100% 96%     General: Awake, no distress.  CV:  Good peripheral perfusion.  Resp:  Normal effort.  Abd:  No distention.  Other:  No intraoral swelling   ED Results / Procedures / Treatments   Labs (all labs ordered are listed, but only abnormal results are displayed) Labs Reviewed  BASIC METABOLIC PANEL WITH GFR - Abnormal; Notable for the following components:      Result Value   CO2 13 (*)    Glucose, Bld 420 (*)    BUN 25 (*)    All other components within normal limits  CBC - Abnormal; Notable for the following components:   WBC 16.9 (*)    All other  components within normal limits  TROPONIN I (HIGH SENSITIVITY)     EKG ED ECG REPORT I, Bryson Carbine, the attending physician, personally viewed and interpreted this ECG.  Date: 10/31/2023 E Rhythm: normal sinus rhythm QRS Axis: normal Intervals: normal ST/T Wave abnormalities: normal Narrative Interpretation: no evidence of acute ischemia     RADIOLOGY Chest x-ray viewed interpret by me, no acute abnormality    PROCEDURES:  Critical Care performed:   Procedures   MEDICATIONS ORDERED IN ED: Medications  methylPREDNISolone sodium succinate (SOLU-MEDROL) 125 mg/2 mL injection 125 mg (125 mg Intravenous Given 10/31/23 1838)  famotidine (PEPCID) IVPB 20 mg premix (0 mg Intravenous Stopped 10/31/23 1905)  diphenhydrAMINE  (BENADRYL ) injection 25 mg (25 mg Intravenous Given 10/31/23 1839)  0.9 %  sodium chloride  infusion (0 mLs Intravenous Stopped 10/31/23 2050)  insulin aspart (novoLOG) injection 8 Units (8 Units Intravenous Given 10/31/23 1906)     IMPRESSION / MDM / ASSESSMENT AND PLAN / ED COURSE  I reviewed  the triage vital signs and the nursing notes. Patient's presentation is most consistent with acute presentation with potential threat to life or bodily function.  Patient presents with allergic reaction as detailed above.  No intraoral swelling or difficulty breathing however she did have some chest discomfort.  EKG, high sensitive troponin are reassuring.  Treated with IV Solu-Medrol, Benadryl , Pepcid and observed in the emergency department  Lab work notable for significant hyperglycemia, will treat with IV fluids and insulin  ----------------------------------------- 8:51 PM on 10/31/2023 ----------------------------------------- Patient is feeling much better, symptoms have resolved, she would like to be discharged.        FINAL CLINICAL IMPRESSION(S) / ED DIAGNOSES   Final diagnoses:  Allergic reaction, initial encounter     Rx / DC Orders   ED  Discharge Orders     None        Note:  This document was prepared using Dragon voice recognition software and may include unintentional dictation errors.   Bryson Carbine, MD 10/31/23 2051

## 2023-10-31 NOTE — ED Notes (Signed)
 POC CBG rechecked prior to discharge.

## 2023-10-31 NOTE — ED Triage Notes (Signed)
 Pt comes with c/o cp. Pt states she was having chest tightness. Pt called her nurse and they thought she might be having reaction to her infusion from earlier today. Pt did have reaction  in past but was premedicated since.   Pt had more pre meds today and had infusion. Pt states itchy throat, lips tingling and ears itching. Pt took 125 ben at home.

## 2023-11-13 DIAGNOSIS — L121 Cicatricial pemphigoid: Secondary | ICD-10-CM | POA: Diagnosis not present

## 2023-11-22 ENCOUNTER — Other Ambulatory Visit: Payer: Self-pay | Admitting: Family

## 2023-12-31 DIAGNOSIS — R590 Localized enlarged lymph nodes: Secondary | ICD-10-CM | POA: Diagnosis not present

## 2023-12-31 DIAGNOSIS — R591 Generalized enlarged lymph nodes: Secondary | ICD-10-CM | POA: Diagnosis not present

## 2023-12-31 DIAGNOSIS — K760 Fatty (change of) liver, not elsewhere classified: Secondary | ICD-10-CM | POA: Diagnosis not present

## 2024-01-05 ENCOUNTER — Other Ambulatory Visit: Payer: Self-pay | Admitting: Family

## 2024-01-07 NOTE — Telephone Encounter (Signed)
 Pt lost to follow up. Attempted to call and inquire if pt still intends to be a patient with this clinic, and schedule follow up to address medication needs. Unable to reach patient.  Per provider, Ellouise Class, give one month refill of valsartan  but must have appointment for any subsequent refills.

## 2024-01-08 DIAGNOSIS — R161 Splenomegaly, not elsewhere classified: Secondary | ICD-10-CM | POA: Diagnosis not present

## 2024-01-08 DIAGNOSIS — R591 Generalized enlarged lymph nodes: Secondary | ICD-10-CM | POA: Diagnosis not present

## 2024-01-08 DIAGNOSIS — L121 Cicatricial pemphigoid: Secondary | ICD-10-CM | POA: Diagnosis not present

## 2024-01-24 DIAGNOSIS — R591 Generalized enlarged lymph nodes: Secondary | ICD-10-CM | POA: Diagnosis not present

## 2024-01-24 DIAGNOSIS — Z79899 Other long term (current) drug therapy: Secondary | ICD-10-CM | POA: Diagnosis not present

## 2024-01-24 DIAGNOSIS — T8090XD Unspecified complication following infusion and therapeutic injection, subsequent encounter: Secondary | ICD-10-CM | POA: Diagnosis not present

## 2024-01-24 DIAGNOSIS — L121 Cicatricial pemphigoid: Secondary | ICD-10-CM | POA: Diagnosis not present

## 2024-02-01 DIAGNOSIS — I152 Hypertension secondary to endocrine disorders: Secondary | ICD-10-CM | POA: Diagnosis not present

## 2024-02-01 DIAGNOSIS — Z79899 Other long term (current) drug therapy: Secondary | ICD-10-CM | POA: Diagnosis not present

## 2024-02-01 DIAGNOSIS — E1159 Type 2 diabetes mellitus with other circulatory complications: Secondary | ICD-10-CM | POA: Diagnosis not present

## 2024-02-01 DIAGNOSIS — E1165 Type 2 diabetes mellitus with hyperglycemia: Secondary | ICD-10-CM | POA: Diagnosis not present

## 2024-02-01 DIAGNOSIS — Z13220 Encounter for screening for lipoid disorders: Secondary | ICD-10-CM | POA: Diagnosis not present

## 2024-02-01 DIAGNOSIS — E611 Iron deficiency: Secondary | ICD-10-CM | POA: Diagnosis not present

## 2024-02-01 DIAGNOSIS — D84821 Immunodeficiency due to drugs: Secondary | ICD-10-CM | POA: Diagnosis not present

## 2024-02-01 DIAGNOSIS — E039 Hypothyroidism, unspecified: Secondary | ICD-10-CM | POA: Diagnosis not present

## 2024-02-04 DIAGNOSIS — Z0000 Encounter for general adult medical examination without abnormal findings: Secondary | ICD-10-CM | POA: Diagnosis not present

## 2024-02-04 DIAGNOSIS — I152 Hypertension secondary to endocrine disorders: Secondary | ICD-10-CM | POA: Diagnosis not present

## 2024-02-04 DIAGNOSIS — E1165 Type 2 diabetes mellitus with hyperglycemia: Secondary | ICD-10-CM | POA: Diagnosis not present

## 2024-02-04 DIAGNOSIS — E1159 Type 2 diabetes mellitus with other circulatory complications: Secondary | ICD-10-CM | POA: Diagnosis not present

## 2024-02-12 ENCOUNTER — Other Ambulatory Visit: Payer: Self-pay | Admitting: Family

## 2024-02-16 ENCOUNTER — Other Ambulatory Visit: Payer: Self-pay | Admitting: Family

## 2024-03-14 ENCOUNTER — Other Ambulatory Visit: Payer: Self-pay | Admitting: Family

## 2024-04-01 ENCOUNTER — Other Ambulatory Visit: Payer: Self-pay

## 2024-04-01 ENCOUNTER — Encounter (HOSPITAL_COMMUNITY): Payer: Self-pay

## 2024-04-01 ENCOUNTER — Emergency Department (HOSPITAL_COMMUNITY)

## 2024-04-01 ENCOUNTER — Emergency Department (HOSPITAL_COMMUNITY)
Admission: EM | Admit: 2024-04-01 | Discharge: 2024-04-02 | Disposition: A | Discharge status: Home / Self Care | Attending: Emergency Medicine | Admitting: Emergency Medicine

## 2024-04-01 DIAGNOSIS — Y93K9 Activity, other involving animal care: Secondary | ICD-10-CM | POA: Diagnosis not present

## 2024-04-01 DIAGNOSIS — M898X8 Other specified disorders of bone, other site: Secondary | ICD-10-CM | POA: Diagnosis not present

## 2024-04-01 DIAGNOSIS — E039 Hypothyroidism, unspecified: Secondary | ICD-10-CM | POA: Diagnosis not present

## 2024-04-01 DIAGNOSIS — W540XXA Bitten by dog, initial encounter: Secondary | ICD-10-CM | POA: Diagnosis not present

## 2024-04-01 DIAGNOSIS — S6991XA Unspecified injury of right wrist, hand and finger(s), initial encounter: Secondary | ICD-10-CM | POA: Diagnosis not present

## 2024-04-01 DIAGNOSIS — I10 Essential (primary) hypertension: Secondary | ICD-10-CM | POA: Insufficient documentation

## 2024-04-01 DIAGNOSIS — S61011A Laceration without foreign body of right thumb without damage to nail, initial encounter: Secondary | ICD-10-CM | POA: Insufficient documentation

## 2024-04-01 DIAGNOSIS — Z79899 Other long term (current) drug therapy: Secondary | ICD-10-CM | POA: Insufficient documentation

## 2024-04-01 NOTE — ED Triage Notes (Signed)
 Pt states that she was bit by her dog on the right hand tonight. Pt states up to date on shots. Bleeding controlled with abrasions to right hand.

## 2024-04-02 MED ORDER — TRIPLE ANTIBIOTIC 3.5-400-5000 EX OINT
TOPICAL_OINTMENT | Freq: Once | CUTANEOUS | Status: AC
  Administered 2024-04-02: 1 via CUTANEOUS
  Filled 2024-04-02: qty 1

## 2024-04-02 MED ORDER — AMOXICILLIN-POT CLAVULANATE 500-125 MG PO TABS: 1.0000 | ORAL_TABLET | Freq: Three times a day (TID) | ORAL | 0 refills | Status: AC

## 2024-04-02 MED ORDER — AMOXICILLIN-POT CLAVULANATE 875-125 MG PO TABS
1.0000 | ORAL_TABLET | Freq: Once | ORAL | Status: AC
  Administered 2024-04-02: 1 via ORAL
  Filled 2024-04-02: qty 1

## 2024-04-02 MED ORDER — TETANUS-DIPHTH-ACELL PERTUSSIS 5-2-15.5 LF-MCG/0.5 IM SUSP
0.5000 mL | Freq: Once | INTRAMUSCULAR | Status: DC
  Filled 2024-04-02: qty 0.5

## 2024-04-02 MED ORDER — FLUCONAZOLE 150 MG PO TABS: 150.0000 mg | ORAL_TABLET | Freq: Every day | ORAL | 1 refills | Status: AC

## 2024-04-02 NOTE — ED Provider Notes (Signed)
 [place] EMERGENCY DEPARTMENT AT [facility] Provider Note   CSN: [id number] Arrival date & time: 04/01/24  2224     Patient presents with: Animal Bite   Krista [name] is a 48 y.o. female.   Patient is a 48 year old female with history of hypertension, hypothyroidism.  Patient presenting today for evaluation of a dog bite.  Her dog was involved in a fight with another dog which she tried to break up and was bitten on her right thumb.  She has a laceration noted in this area along with abrasions.  Bleeding controlled with direct pressure.  Patient's last tetanus is unknown.  Dog's shots are up-to-date.       Prior to Admission medications   Medication Sig Start Date End Date Taking? Authorizing Provider  albuterol  (PROAIR  HFA) 108 (90 Base) MCG/ACT inhaler Inhale 2 puffs into the lungs every 6 (six) hours as needed for wheezing or shortness of breath.  10/01/18   [provider]  atomoxetine (STRATTERA) 80 MG capsule Take 80 mg by mouth daily.    [provider]  baclofen (LIORESAL) 10 MG tablet Take 10 mg by mouth 2 (two) times daily.    [provider]  cloNIDine (CATAPRES) 0.1 MG tablet Take 0.1 mg by mouth daily.    [provider]  doxycycline  (PERIOSTAT ) 20 MG tablet Take 1 tablet (20 mg total) by mouth 2 (two) times daily. Patient not taking: Reported on 02/23/2023 01/24/23   Donette Ellouise LABOR, FNP  furosemide  (LASIX ) 20 MG tablet Take 1 tablet (20 mg total) by mouth daily. 03/16/23 06/14/23  Donette Ellouise LABOR, FNP  hydrOXYzine  (ATARAX ) 10 MG tablet TAKE 1 TABLET BY MOUTH THREE TIMES A DAY AS NEEDED 04/19/23   Signa Delon A, NP  lisinopril  (PRINIVIL ,ZESTRIL ) 10 MG tablet Take 1 tablet (10 mg total) by mouth daily. Patient taking differently: Take 20 mg by mouth daily. 08/06/17   Signa Delon LABOR, NP  meloxicam  (MOBIC ) 15 MG tablet Take 15 mg by mouth daily.    [provider]  metFORMIN  (GLUCOPHAGE ) 500 MG tablet  Take 1 tablet (500 mg total) by mouth 2 (two) times daily. 05/14/17   Jayne Vonn DEL, MD  Methylphenidate HCl ER, PM, (JORNAY PM) 60 MG CP24 Take 60 mg by mouth daily.    [provider]  mycophenolate  (CELLCEPT ) 250 MG capsule Take 4 capsules (1,000 mg total) by mouth 2 (two) times daily. Patient not taking: Reported on 02/23/2023 01/24/23   Donette Ellouise LABOR, FNP  predniSONE  (DELTASONE ) 5 MG tablet Take 2 tablets (10 mg total) by mouth daily with breakfast. Patient not taking: Reported on 02/23/2023 01/24/23   Donette Ellouise LABOR, FNP  spironolactone  (ALDACTONE ) 25 MG tablet TAKE 1 TABLET (25 MG TOTAL) BY MOUTH DAILY. 11/27/23 02/25/24  Donette Ellouise LABOR, FNP  tacrolimus  (PROGRAF ) 1 MG capsule Take 1 capsule (1 mg total) by mouth 2 (two) times daily. Patient not taking: Reported on 02/23/2023 01/24/23   Donette Ellouise LABOR, FNP  valsartan  (DIOVAN ) 40 MG tablet Take 1 tablet (40 mg total) by mouth daily. Needs appt for future refills. 01/07/24   Donette Ellouise LABOR, FNP    Allergies: Propofol     Review of Systems  All other systems reviewed and are negative.   Updated Vital Signs BP (!) 178/86   Pulse 82   Temp 98.1 F (36.7 C)   Resp 18   Wt 131.5 kg   LMP 06/01/2017   SpO2 93%   BMI  41.61 kg/m   Physical Exam Vitals and nursing note reviewed.  Constitutional:      Appearance: Normal appearance.  Pulmonary:     Effort: Pulmonary effort is normal.  Musculoskeletal:     Comments: There is a 1.5 cm laceration noted to the extensor surface just proximal to the MCP joint of the right thumb.  There is no tendon involvement and she is able to flex and extend her thumb without difficulty.  Skin:    General: Skin is warm and dry.  Neurological:     Mental Status: She is alert and oriented to person, place, and time.     (all labs ordered are listed, but only abnormal results are displayed) Labs Reviewed - No data to display  EKG: None  Radiology: DG Hand Complete Right Result Date:  04/01/2024 EXAM: 3 OR MORE VIEW(S) XRAY OF THE RIGHT HAND 04/01/2024 11:20:00 PM COMPARISON: None available. CLINICAL HISTORY: dog bite. Dog bite dog bite. Dog bite FINDINGS: BONES AND JOINTS: Normal bone mineralization without evidence of fracture or dislocation. There is beginning trace degenerative spurring at the lateral aspect of the thumb MCP and IP joints. Arthritic changes are not seen elsewhere. There is 3 mm of negative ulnar variance with otherwise normal osseous alignment. A bone island incidentally is noted in the scaphoid waist. No other significant focal bone abnormality is seen. SOFT TISSUES: There is no appreciable soft tissue gas or visible foreign body. Soft tissues are unremarkable. IMPRESSION: 1. No acute osseous abnormality related to the dog bite. Unremarkable soft tissues. Electronically signed by: Francis Quam MD 04/01/2024 11:34 PM EDT RP Workstation: [id number]     Procedures   Medications Ordered in the ED  Tdap (ADACEL) injection 0.5 mL (has no administration in time range)  amoxicillin -clavulanate (AUGMENTIN ) 875-125 MG per tablet 1 tablet (has no administration in time range)  neomycin-bacitracin-polymyxin [phone] OINT (has no administration in time range)                                    Medical Decision Making Risk OTC drugs. Prescription drug management.   Patient presenting with a dog bite to her right hand as described in the HPI and physical exam section of this note.  There is a laceration over the dorsal aspect of the hand just proximal to the MCP joint of the right thumb, but no tendon involvement.  No suturing indicated given this is an animal bite.  She will be treated with Augmentin  and local wound care.  Patient will also be prescribed Augmentin  and advised to perform wound care at home.  She is to quarantine the dog and return as needed.  Tetanus updated.     Final diagnoses:  None    ED Discharge Orders     None           Geroldine Berg, MD 04/02/24 [phone]

## 2024-04-02 NOTE — Discharge Instructions (Addendum)
 Begin taking Augmentin  as prescribed.  Local wound care with bacitracin and dressing changes twice daily.  Quarantine the dog for the next 10 days.  If the dog is well in 10 days, no further action is required.  Return to the emergency department for pus draining from the wound, red streaks extending from the wound, or increasing pain, or for other new and concerning symptoms.

## 2024-04-21 DIAGNOSIS — L121 Cicatricial pemphigoid: Secondary | ICD-10-CM | POA: Diagnosis not present

## 2024-05-05 DIAGNOSIS — L121 Cicatricial pemphigoid: Secondary | ICD-10-CM | POA: Diagnosis not present

## 2024-05-19 DIAGNOSIS — L718 Other rosacea: Secondary | ICD-10-CM | POA: Diagnosis not present

## 2024-05-19 DIAGNOSIS — Z872 Personal history of diseases of the skin and subcutaneous tissue: Secondary | ICD-10-CM | POA: Diagnosis not present

## 2024-05-19 DIAGNOSIS — Z86018 Personal history of other benign neoplasm: Secondary | ICD-10-CM | POA: Diagnosis not present

## 2024-05-19 DIAGNOSIS — L578 Other skin changes due to chronic exposure to nonionizing radiation: Secondary | ICD-10-CM | POA: Diagnosis not present

## 2024-05-27 ENCOUNTER — Other Ambulatory Visit: Payer: Self-pay | Admitting: Adult Health
